# Patient Record
Sex: Female | Born: 1985 | Race: White | Hispanic: No | Marital: Single | Smoking: Never smoker
Health system: Southern US, Community
[De-identification: ages and names within clinical notes are randomized; demographics above are authoritative.]

## PROBLEM LIST (undated history)

## (undated) DIAGNOSIS — E559 Vitamin D deficiency, unspecified: Secondary | ICD-10-CM

## (undated) DIAGNOSIS — S73191A Other sprain of right hip, initial encounter: Secondary | ICD-10-CM

## (undated) DIAGNOSIS — M25851 Other specified joint disorders, right hip: Secondary | ICD-10-CM

## (undated) DIAGNOSIS — I341 Nonrheumatic mitral (valve) prolapse: Secondary | ICD-10-CM

## (undated) DIAGNOSIS — E119 Type 2 diabetes mellitus without complications: Secondary | ICD-10-CM

## (undated) HISTORY — DX: Type 2 diabetes mellitus without complications: E11.9

## (undated) HISTORY — DX: Nonrheumatic mitral (valve) prolapse: I34.1

## (undated) HISTORY — DX: Other sprain of right hip, initial encounter: S73.191A

## (undated) HISTORY — DX: Other specified joint disorders, right hip: M25.851

---
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
## 2007-09-09 HISTORY — PX: TONSILLECTOMY: SUR1361

## 2020-05-08 HISTORY — PX: WISDOM TOOTH EXTRACTION: SHX21

## 2020-07-12 HISTORY — PX: HIP SURGERY: SHX245

## 2020-10-25 ENCOUNTER — Encounter: Payer: Self-pay | Admitting: Family Medicine

## 2020-10-25 ENCOUNTER — Other Ambulatory Visit: Payer: Self-pay

## 2020-10-25 ENCOUNTER — Ambulatory Visit
Admission: RE | Admit: 2020-10-25 | Discharge: 2020-10-25 | Disposition: A | Discharge status: Home / Self Care | Payer: BC Managed Care – PPO | Source: Ambulatory Visit | Attending: Family Medicine | Admitting: Family Medicine

## 2020-10-25 ENCOUNTER — Ambulatory Visit (INDEPENDENT_AMBULATORY_CARE_PROVIDER_SITE_OTHER): Payer: BC Managed Care – PPO | Admitting: Family Medicine

## 2020-10-25 ENCOUNTER — Ambulatory Visit
Admission: RE | Admit: 2020-10-25 | Discharge: 2020-10-25 | Disposition: A | Discharge status: Home / Self Care | Payer: BC Managed Care – PPO | Attending: Family Medicine | Admitting: Family Medicine

## 2020-10-25 VITALS — BP 104/78 | HR 74 | Temp 98.1°F | Ht 65.0 in | Wt 213.0 lb

## 2020-10-25 DIAGNOSIS — S73191A Other sprain of right hip, initial encounter: Secondary | ICD-10-CM | POA: Insufficient documentation

## 2020-10-25 DIAGNOSIS — M25552 Pain in left hip: Secondary | ICD-10-CM | POA: Diagnosis not present

## 2020-10-25 DIAGNOSIS — N912 Amenorrhea, unspecified: Secondary | ICD-10-CM | POA: Diagnosis not present

## 2020-10-25 DIAGNOSIS — M5417 Radiculopathy, lumbosacral region: Secondary | ICD-10-CM

## 2020-10-25 DIAGNOSIS — E119 Type 2 diabetes mellitus without complications: Secondary | ICD-10-CM | POA: Diagnosis not present

## 2020-10-25 DIAGNOSIS — M76891 Other specified enthesopathies of right lower limb, excluding foot: Secondary | ICD-10-CM | POA: Insufficient documentation

## 2020-10-25 DIAGNOSIS — M545 Low back pain, unspecified: Secondary | ICD-10-CM | POA: Diagnosis not present

## 2020-10-25 DIAGNOSIS — S73191S Other sprain of right hip, sequela: Secondary | ICD-10-CM

## 2020-10-25 DIAGNOSIS — M25851 Other specified joint disorders, right hip: Secondary | ICD-10-CM | POA: Insufficient documentation

## 2020-10-25 DIAGNOSIS — M25551 Pain in right hip: Secondary | ICD-10-CM | POA: Insufficient documentation

## 2020-10-25 MED ORDER — DULOXETINE HCL 30 MG PO CPEP: ORAL_CAPSULE | ORAL | 0 refills | Status: DC

## 2020-10-25 MED ORDER — DICLOFENAC SODIUM 50 MG PO TBEC: 50.0000 mg | DELAYED_RELEASE_TABLET | Freq: Two times a day (BID) | ORAL | 0 refills | Status: DC

## 2020-10-25 NOTE — Patient Instructions (Signed)
-   Obtain x-rays - Start diclofenac every a.m. and every p.m. with food - Start duloxetine nightly, increase to 2 capsules after 2 weeks - Referral coordinator will contact you for follow-up with physical therapy, endocrinology, gynecology - Can contact the number below for expedited scheduling - Return for follow-up in 4 weeks, contact us for any questions between now and then  [facility]:  [place]:  [phone]

## 2020-10-25 NOTE — Assessment & Plan Note (Signed)
See additional assessment(s) for plan details. 

## 2020-10-25 NOTE — Assessment & Plan Note (Signed)
Patient with roughly 3 month history of atraumatic left hip pain, localized to the groin without overt radiation.  This is in the setting of right hip arthroscopy for labral tear surgery 3 months prior while out of state in [place].  She had started physical therapy for her right hip but this was limited due to moved to [facility] and stated concerns over physical therapy group.  She does express burning sensation along the left lateral thigh at times to the left foot.  Physical exam findings are positive for focal tenderness at the left groin, equivocal FABER, positive FADIR that recreates pain, negative iliopsoas testing, positive straight leg raise on the left, and tenderness at the left SI joint.  Given her constellation of findings she is most likely experiencing secondary/compensatory pain at the left hip joint and lumbosacral spine where there is concern for left-sided radiculopathy, these are all in the setting of recent right hip surgery.  Have advised further evaluation with dedicated x-rays of the left hip and lumbosacral spine, initiation of duloxetine with titration from 30 mg to 60 mg in 2 weeks time, scheduled diclofenac twice daily, and close follow-up for reevaluation.  We will attempt to obtain outside records from her surgeon for the right hip labrum.

## 2020-10-25 NOTE — Assessment & Plan Note (Signed)
Longstanding amenorrhea in the setting of diabetes mellitus type 2, referral to GYN placed today for further evaluation and management.

## 2020-10-25 NOTE — Progress Notes (Signed)
Primary Care / Sports Medicine Office Visit  Patient Information:  Patient ID: Debbie Travis, female DOB: 1985-10-28 Age: 35 y.o. MRN: [id number]   Debbie Travis is a pleasant 35 y.o. female presenting with the following:  Chief Complaint  Patient presents with   New Patient (Initial Visit)   Establish Care   Hip Pain    Bilateral; history of right hip impingement and surgery; left hip compensating per patient; requesting MRI; 4/10 pain    Review of Systems pertinent details above   Patient Active Problem List   Diagnosis Date Noted   Arthralgia of left hip 10/25/2020   Left lumbosacral radiculopathy 10/25/2020   Diabetes mellitus without complication (HCC) 10/25/2020   Amenorrhea 10/25/2020   Hip impingement syndrome, right 10/25/2020   Labral tear of right hip joint 10/25/2020   Past Medical History:  Diagnosis Date   Diabetes mellitus without complication (HCC)    Hip impingement syndrome, right    Labral tear of right hip joint    Mitral valve prolapse    Outpatient Encounter Medications as of 10/25/2020  Medication Sig   diclofenac (VOLTAREN) 50 MG EC tablet Take 1 tablet (50 mg total) by mouth 2 (two) times daily.   DULoxetine (CYMBALTA) 30 MG capsule Take 1 capsule (30 mg total) by mouth every evening for 14 days, THEN 2 capsules (60 mg total) every evening for 16 days.   gabapentin (NEURONTIN) 300 MG capsule Take 300 mg by mouth at bedtime.   Insulin Regular Human (HUMULIN R U-500 KWIKPEN [place]) Inject 25-50 Units into the skin in the morning, at noon, and at bedtime.   meloxicam (MOBIC) 15 MG tablet Take 15 mg by mouth daily. (Patient not taking: Reported on 10/25/2020)   metoprolol succinate (TOPROL-XL) 50 MG 24 hr tablet Take 50 mg by mouth daily. (Patient not taking: Reported on 10/25/2020)   [DISCONTINUED] insulin aspart (NOVOLOG) 100 UNIT/ML FlexPen insulin aspart (niacinamide)(U-100) 100 unit/mL(3 mL) subcutaneous pen  Inject by subcutaneous  route.   No facility-administered encounter medications on file as of 10/25/2020.   Past Surgical History:  Procedure Laterality Date   CHOLECYSTECTOMY  03/2003   HIP SURGERY Right 07/12/2020   femoral acetabullar impingement   LEG TENDON SURGERY Left 2004   TONSILLECTOMY Bilateral 09/2007   WISDOM TOOTH EXTRACTION Left 05/2020    Vitals:   10/25/20 0836  BP: 104/78  Pulse: 74  Temp: 98.1 F (36.7 C)  SpO2: 98%   Vitals:   10/25/20 0836  Weight: 213 lb (96.6 kg)  Height: 5\' 5"  (1.651 m)   Body mass index is 35.45 kg/m.  No results found.   Independent interpretation of notes and tests performed by another provider:   None  Procedures performed:   None  Pertinent History, Exam, Impression, and Recommendations:   Arthralgia of left hip Patient with roughly 3 month history of atraumatic left hip pain, localized to the groin without overt radiation.  This is in the setting of right hip arthroscopy for labral tear surgery 3 months prior [place]  She had started physical therapy for her right hip but this was limited due to moved to [facility] and stated concerns over physical therapy group.  She does express burning sensation along the left lateral thigh at times to the left foot.  Physical exam findings are positive for focal tenderness at the left groin, equivocal FABER, positive FADIR that recreates pain, negative iliopsoas testing, positive straight  leg raise on the left, and tenderness at the left SI joint.  Given her constellation of findings she is most likely experiencing secondary/compensatory pain at the left hip joint and lumbosacral spine where there is concern for left-sided radiculopathy, these are all in the setting of recent right hip surgery.  Have advised further evaluation with dedicated x-rays of the left hip and lumbosacral spine, initiation of duloxetine with titration from 30 mg to 60 mg in 2 weeks time, scheduled  diclofenac twice daily, and close follow-up for reevaluation.  We will attempt to obtain outside records from her surgeon for the right hip labrum.  Labral tear of right hip joint See additional assessment(s) for plan details.  Hip impingement syndrome, right See additional assessment(s) for plan details.  Left lumbosacral radiculopathy See additional assessment(s) for plan details.  Diabetes mellitus without complication (HCC) Chronic condition for which she has previously been established with endocrinology, has expressed desire for the same.  She is tolerating current insulin, have discussed lifestyle measures and a referral to endocrinology has been placed.  Plan for risk stratification labs at her return for annual physical.  Amenorrhea Longstanding amenorrhea in the setting of diabetes mellitus type 2, referral to GYN placed today for further evaluation and management.   Orders & Medications Meds ordered this encounter  Medications   DULoxetine (CYMBALTA) 30 MG capsule    Sig: Take 1 capsule (30 mg total) by mouth every evening for 14 days, THEN 2 capsules (60 mg total) every evening for 16 days.    Dispense:  46 capsule    Refill:  0   diclofenac (VOLTAREN) 50 MG EC tablet    Sig: Take 1 tablet (50 mg total) by mouth 2 (two) times daily.    Dispense:  60 tablet    Refill:  0   Orders Placed This Encounter  Procedures   DG Lumbar Spine Complete   DG Hip Unilat W OR W/O Pelvis Min 4 Views Left   Ambulatory referral to Endocrinology   Ambulatory referral to Gynecology   Ambulatory referral to Physical Therapy     Return in about 4 weeks (around 11/22/2020).     Jerrol Banana, MD   Primary Care Sports Medicine [facility]

## 2020-10-25 NOTE — Assessment & Plan Note (Addendum)
Chronic condition for which she has previously been established with endocrinology, has expressed desire for the same.  She is tolerating current insulin, have discussed lifestyle measures and a referral to endocrinology has been placed.  Plan for risk stratification labs at her return for annual physical.

## 2020-10-27 ENCOUNTER — Ambulatory Visit: Payer: BC Managed Care – PPO | Attending: Family Medicine | Admitting: Physical Therapy

## 2020-10-27 ENCOUNTER — Other Ambulatory Visit: Payer: Self-pay

## 2020-10-27 DIAGNOSIS — S73191S Other sprain of right hip, sequela: Secondary | ICD-10-CM | POA: Diagnosis not present

## 2020-10-27 DIAGNOSIS — M25551 Pain in right hip: Secondary | ICD-10-CM | POA: Insufficient documentation

## 2020-10-27 DIAGNOSIS — M25851 Other specified joint disorders, right hip: Secondary | ICD-10-CM | POA: Insufficient documentation

## 2020-10-27 DIAGNOSIS — M6281 Muscle weakness (generalized): Secondary | ICD-10-CM | POA: Insufficient documentation

## 2020-10-27 DIAGNOSIS — M5417 Radiculopathy, lumbosacral region: Secondary | ICD-10-CM | POA: Insufficient documentation

## 2020-10-27 DIAGNOSIS — M25552 Pain in left hip: Secondary | ICD-10-CM | POA: Diagnosis not present

## 2020-10-27 NOTE — Therapy (Addendum)
[facility] [address] [place], [place], [address] Phone: [phone]   Fax:  [phone]  Physical Therapy Evaluation  Patient Details  Name: Debbie Travis MRN: [id number] Date of Birth: 1985/12/31 Referring Provider (PT): Jerrol Banana, MD  Encounter Date: 10/27/2020   PT End of Session - 10/28/20 1154     Visit Number 1    Number of Visits 17    Date for PT Re-Evaluation 12/22/20    Authorization Type BCBS, [place] based on [place]    Progress Note Due on Visit 10    PT Start Time 0905    PT Stop Time 0950    PT Time Calculation (min) 45 min    Activity Tolerance Patient tolerated treatment well;No increased pain    Behavior During Therapy WFL for tasks assessed/performed             Past Medical History:  Diagnosis Date   Diabetes mellitus without complication (HCC)    Hip impingement syndrome, right    Labral tear of right hip joint    Mitral valve prolapse     Past Surgical History:  Procedure Laterality Date   CHOLECYSTECTOMY  03/2003   HIP SURGERY Right 07/12/2020   femoral acetabullar impingement   LEG TENDON SURGERY Left 2004   TONSILLECTOMY Bilateral 09/2007   WISDOM TOOTH EXTRACTION Left 05/2020    There were no vitals filed for this visit.    Subjective Assessment - 10/28/20 1156     Subjective Pt is a 35 year old female with L hip pain, R hip s/p labral repair 07/12/20.    Pertinent History Pt is a 35 year old female with complaint of bilateral hip pain. She is s/p R labral repair and resection/acetabuloplasty (pt did not specify acetabulum versus CAM resection, no operative report in chart) for hip impingement (DOS: 07/12/2020). Patient reports L hip feels similar to how R side felt before surgery, but not as much locking and clicking. Pt exhibits "C" sign. Patient reports L hip pain is anterior. Patient does a lot of sitting during her day. Pt reports having pain down to her R knee intermittently.  Patient reports no recent numbness/tingling. (-) imaging on lumbar spine and pelvis/hip. Patient reports that medication "has not worked yet." Patient reports that her surgery was postponed notably due to difficulty with diabetes management. Pt reports waking up due to low blood sugar. Patient reports difficulty getting to sleep due to hip pain, but she does not report disturbed sleep.    Limitations House hold activities;Sitting;Other (comment);Standing   stair negotiation, self-care/tying shoes   Diagnostic tests Radiographs on lumbar spine and pelvis/hips (see below), able to exercise with elliptical    Patient Stated Goals To be able to walk up/down stairs normally    Currently in Pain? Yes    Aggravating Factors  prolonged sitting, bending over, going up steps, difficulty tying shoes due to discomfort with crossing legs, prolonged weightbearing bothers R hip    Pain Relieving Factors on the move/walking               [facility] PT Assessment - 10/27/20 1006       Assessment   Medical Diagnosis Arthralgia of left hip, left lumbosacral radiculopathy, hip impingement syndrome, tear of right acetabular labrum    Referring Provider (PT) Jerrol Banana, MD    Onset Date/Surgical Date 07/12/20    Next MD Visit 11/22/20    Prior Therapy Post-op PT for R hip  Precautions   Precautions None      Restrictions   Weight Bearing Restrictions No      Balance Screen   Has the patient fallen in the past 6 months Yes   1 fall during hypoglyemic episode   How many times? 1    Has the patient had a decrease in activity level because of a fear of falling?  No    Is the patient reluctant to leave their home because of a fear of falling?  No      Prior Function   Level of Independence Independent      Cognition   Overall Cognitive Status Within Functional Limits for tasks assessed              SUBJECTIVE Chief complaint:  Pt is a 35 year old female with L hip pain, R hip s/p labral  repair 07/12/20. History: Pt is a 35 year old female with complaint of bilateral hip pain. She is s/p R labral repair and resection/acetabuloplasty (pt did not specify acetabulum versus CAM resection, no operative report in chart) for hip impingement (DOS: 07/12/2020). Patient reports L hip feels similar to how R side felt before surgery, but not as much locking and clicking. Pt exhibits "C" sign. Patient reports L hip pain is anterior. Patient does a lot of sitting during her day. Pt reports having pain down to her R knee intermittently. Patient reports no recent numbness/tingling. (-) imaging on lumbar spine and pelvis/hip. Patient reports that medication "has not worked yet." Patient reports that her surgery was postponed notably due to difficulty with diabetes management. Pt reports waking up due to low blood sugar. Patient reports difficulty getting to sleep due to hip pain, but she does not report disturbed sleep.   Referring Dx: Arthralgia of left hip, left lumbosacral radiculopathy, hip impingement syndrome, tear of right acetabular labrum Referring Provider: Joseph Berkshire, MD Pain location: L anterior hip, R posterolateral hip  Pain: Worst 6-8/10: ("Sometimes R doesn't hurt at all") Pain quality: sharp, burning, "like it's tearing" Radiating pain: Yes pain into R thigh/knee  Numbness/Tingling: No 24 hour pain behavior: None Aggravating factors: prolonged sitting, bending over, going up steps, difficulty tying shoes due to discomfort with crossing legs, prolonged weightbearing bothers R hip Easing factors: on the move/walking  History of back/hip injury, pain, surgery, or therapy: Yes, R labral repair and debridement 07/12/20 Follow-up appointment with MD: Yes, 11/22/20  Imaging: Yes, radiographs on lumbar spine and Left pelvis/hip. Lumbar spine with no acute abnormalities. Pelvis/hip radiographs with no acute abnormalities.   Falls in the last 6 months: Yes, blood sugar crash about 1 week  ago; difficulty remembering how she fell  Occupational demands: Public librarian, on  computer, work from home Hobbies: cardiovascular exercise with elliptical Goals: Be able to walk up/down stairs normally Red flags (bowel/bladder changes, saddle paresthesia, personal history of cancer, chills/fever, night sweats, unrelenting pain, first onset of insidious LBP <20 y/o) Negative     OBJECTIVE  Mental Status Patient is oriented to person, place and time.  Recent memory is intact.  Remote memory is intact.  Attention span and concentration are intact.  Expressive speech is intact.  Patient's fund of knowledge is within normal limits for educational level.  SENSATION: Grossly intact to light touch bilateral LEs as determined by testing dermatomes L2-S2 Proprioception and hot/cold testing deferred on this date   MUSCULOSKELETAL: Tremor: None Bulk: Normal Tone: Normal No visible step-off along spinal column  Posture Lumbar lordosis: WNL Iliac crest height:  equal bilaterally Mild self-selected kyphotic posture in sitting  Gait Contralateral pelvic drop L>R during stance phase, good weight shift and heel to toe progression   Palpation  Tenderness to palpation along bilateral gluteus medius, R>L vastus lateralis   Strength (out of 5) R/L 4/4- Hip flexion 4+/4 Hip ER 4/4 Hip IR 4-/4- Hip abduction 5/5 Hip adduction [not tested] Hip extension 5/5 Knee extension 4+/4+ Knee flexion *Indicates pain    AROM (degrees) R/L (all movements include overpressure unless otherwise stated) Lumbar forward flexion (65): 75%  Lumbar extension (30): 100% Lumbar lateral flexion (25): R: 100% (pain contralaateral lateral hip) L: 100% (pain contralateral lateral hip) Thoracic and Lumbar rotation (30 degrees):  R: WNL L: WNL Hip IR (0-45): R: 45, L: 45  Hip ER (0-45): R: 45*, L: 45 Hip Flexion (0-125): R WNL, L WNL *Indicates pain   PROM (degrees) Hip IR (0-45): R: 45, L: 45 Hip  ER (0-45): R: 45*, L: 45 Hip Flexion (0-125): R 120, L 120 Hip Abduction (0-40): R: WNL L: WNL Hip extension (0-15): R: not tested L: not tested    Muscle Length Hamstrings: R: lacking 25 degrees L: lacking 20 degrees  [place]: R Normal, L Normal    SPECIAL TESTS Lumbar Radiculopathy and Discogenic: Centralization and Peripheralization (SN 92, -LR 0.12): Not done Slump (SN 83, -LR 0.32): R: Negative L: Negative SLR (SN 92, -LR 0.29): R: Negative L:  Negative Crossed SLR (SP 90): R: Negative L: Negative  Hip: FABER (SN 81): R: Positive L: Negative FADIR (SN 94): R: Negative L: Positive Hip scour (SN 50): R: Not examined L: Not examined  SIJ:  Sacral compression: Negative     Therapeutic Activities Review of home exercises (see Med[employer] Access code) and patient education on current condition, role of PT, prognosis, POC.      ASSESSMENT Clinical Impression: Pt is a pleasant 35 year old female s/p R labral repair (DOS: 07/12/20) with comorbid complaint of L hip pain with positive "C" sign. Clinical presentation consistent with FAI affecting L hip complex with expected R post-operative impairments present at this time. PT examination reveals deficits in difficulty accessing combined FABER position with RLE as needed for crossing legs and performing self-care tasks, decreased hip abduction/flexor/ER/IR and hamstrings strength, mild gait changes, local nociceptive pain in bilateral hips, sensitivity of bilateral gluteus medius and R>L vastus lateralis, and difficulty with prolonged sitting, prolonged standing/walking, bending, and self-care ADLs (those requiring trunk/hip flexion). Pt presents with deficits in strength, mobility, range of motion, and pain. Pt will benefit from skilled PT services to address deficits and return to pain-free function at home and work.     PT Short Term Goals - 10/28/20 1225       PT SHORT TERM GOAL #1   Title Pt will be independent and 100% compliant  with established HEP and activity modification as needed to augment PT intervention and improve mobility/strength to improve level of function    Baseline 10/27/20: Baseline HEP initiated    Time 3    Period Weeks    Status New    Target Date 11/17/20      PT SHORT TERM GOAL #2   Title Patient will have full trunk and hip ROM in all planes without increase in pain as needed for requisite hip mobility to perform self-care ADLs/dressing and bed mobility tasks    Baseline 10/27/20: Pain with bilat thoracolumbar lateral flexion, motion loss with lumbar flexion, pain with R hip FABER position.  Time 4    Period Weeks    Status New    Target Date 11/25/20               PT Long Term Goals - 10/27/20 0959       PT LONG TERM GOAL #1   Title Patient will demonstrate improved function as evidenced by a score of 70 on FOTO measure for full participation in activities at home and in the community.    Baseline 10/27/20: 55    Time 8    Period Weeks    Status New    Target Date 12/22/20      PT LONG TERM GOAL #2   Title Pt will decrease worst pain as reported on NPRS by at least 2 points in order to demonstrate clinically significant reduction in hip pain.    Baseline 10/27/20: pain 6-8/10 at worst    Time 8    Period Weeks    Status New    Target Date 12/22/20      PT LONG TERM GOAL #3   Title Patient will have hip strength to 4+/5 or greater for all motions tested without reproduction of pain indicative of improved capacity for loading paraspinal/pelvic and gluteal mm as needed for capacity to perform prolonged weightbearing activity and return to desired exercises requiring higher-demand muscle performance    Baseline 10/27/20: Hip strength for flexors, L ER, bilat IR, and abductors 4- to 4.    Time 8    Period Weeks    Status New    Target Date 12/22/20      PT LONG TERM GOAL #4   Title Patient will negotiate 4 steps in gym 3 times consecutively with reciprocal pattern and no  UE support on handrail mimicking similar volume of negotiating full flight of stairs with no reproduction of pain and no pelvic drop or other major movement deviation    Baseline 10/27/20: Difficulty with negotiating stairs with her normal pattern/reciprocal stepping.    Time 8    Period Weeks    Status New    Target Date 12/22/20                    Plan - 10/28/20 1220     Clinical Impression Statement Pt is a pleasant 35 year old female s/p R labral repair (DOS: 07/12/20) with comorbid complaint of L hip pain with positive "C" sign. Clinical presentation consistent with FAI affecting L hip complex with expected R post-operative impairments present at this time. PT examination reveals deficits in difficulty accessing combined FABER position with RLE as needed for crossing legs and performing self-care tasks, decreased hip abduction/flexor/ER/IR and hamstrings strength, mild gait changes, local nociceptive pain in bilateral hips, sensitivity of bilateral gluteus medius and R>L vastus lateralis, and difficulty with prolonged sitting, prolonged standing/walking, bending, and self-care ADLs (those requiring trunk/hip flexion). Pt presents with deficits in strength, mobility, range of motion, and pain. Pt will benefit from skilled PT services to address deficits and return to pain-free function at home and work.    Personal Factors and Comorbidities Comorbidity 3+    Comorbidities Type 2 DM, hx of R labral tear and labral repair, mitral valve prolapse    Examination-Activity Limitations Dressing;Sit;Stand;Locomotion Level;Bend;Stairs;Squat    Examination-Participation Restrictions Community Activity;Occupation;Driving    Stability/Clinical Decision Making Evolving/Moderate complexity    Clinical Decision Making Moderate    Rehab Potential Good    PT Frequency 2x / week    PT Duration 8 weeks  PT Treatment/Interventions Electrical Stimulation;Cryotherapy;Moist Heat;Functional mobility  training;Therapeutic activities;Therapeutic exercise;Neuromuscular re-education;Patient/family education;Manual techniques;Dry needling    PT Next Visit Plan Manual therapy for symptom modulation, strategies to improve tolerance of hip ROM, graded hip strengthening with gradual introduction of weightbearing activity/CKC strengthening    PT Home Exercise Plan Access Code [email or web address]    Consulted and Agree with Plan of Care Patient             Patient will benefit from skilled therapeutic intervention in order to improve the following deficits and impairments:  Abnormal gait, Decreased strength, Pain, Difficulty walking  Visit Diagnosis: Pain in left hip - Plan: PT plan of care cert/re-cert  Pain in right hip - Plan: PT plan of care cert/re-cert  Muscle weakness (generalized) - Plan: PT plan of care cert/re-cert     Problem List Patient Active Problem List   Diagnosis Date Noted   Arthralgia of left hip 10/25/2020   Left lumbosacral radiculopathy 10/25/2020   Diabetes mellitus without complication (HCC) 10/25/2020   Amenorrhea 10/25/2020   Hip impingement syndrome, right 10/25/2020   Labral tear of right hip joint 10/25/2020   Consuela Mimes, PT, DPT #[id number]  Gertie Exon, PT 10/28/2020, 12:47 PM  [facility] [address] [place], [place], [address] Phone: [phone]   Fax:  [phone]  Name: Debbie Travis MRN: [id number] Date of Birth: 05-02-1985

## 2020-10-28 ENCOUNTER — Encounter: Payer: Self-pay | Admitting: Physical Therapy

## 2020-10-31 ENCOUNTER — Ambulatory Visit: Payer: BC Managed Care – PPO | Admitting: Physical Therapy

## 2020-11-01 ENCOUNTER — Ambulatory Visit: Payer: BC Managed Care – PPO | Admitting: Physical Therapy

## 2020-11-02 ENCOUNTER — Ambulatory Visit: Payer: BC Managed Care – PPO | Admitting: Physical Therapy

## 2020-11-03 ENCOUNTER — Ambulatory Visit (INDEPENDENT_AMBULATORY_CARE_PROVIDER_SITE_OTHER): Payer: BC Managed Care – PPO | Admitting: Advanced Practice Midwife

## 2020-11-03 ENCOUNTER — Other Ambulatory Visit: Payer: Self-pay

## 2020-11-03 ENCOUNTER — Encounter: Payer: BC Managed Care – PPO | Admitting: Physical Therapy

## 2020-11-03 ENCOUNTER — Encounter: Payer: Self-pay | Admitting: Advanced Practice Midwife

## 2020-11-03 VITALS — BP 110/78 | HR 100 | Ht 65.0 in | Wt 212.0 lb

## 2020-11-03 DIAGNOSIS — N912 Amenorrhea, unspecified: Secondary | ICD-10-CM | POA: Diagnosis not present

## 2020-11-03 DIAGNOSIS — E8881 Metabolic syndrome: Secondary | ICD-10-CM | POA: Diagnosis not present

## 2020-11-03 NOTE — Progress Notes (Signed)
Patient ID: Debbie Travis, female   DOB: 06-Mar-1985, 35 y.o.   MRN: [id number]  Reason for Consult: Gynecologic Exam (C/o no period is 2 yrs or more.)   Referred by Jerrol Banana, MD  Subjective:  HPI:  Debbie Travis is a 35 y.o. female being seen for amenorrhea that is ongoing for the past 2 years. Prior to that she had irregular periods every 30-40 days lasting 5-7 days of moderate flow since the birth of her son 16 years ago. She did try Depo about 6 years ago for about 1 year and prior to that she had limited use of hormonal birth control. She has not had gyn imaging or hormone level testing. She does have history of Type 2 DM on insulin. She denies hirsutism. She is not currently sexually active and does not have concerns for STDs. She does not have a family history of breast or ovarian cancer. She has no desire for conception and she does not care much about having a period unless it is medically necessary. We discussed possible PCOS, hormone testing, gyn imaging. She has a history of cholecystectomy and sometimes has right side abdominal pain especially after eating.  She is a non-smoker, admits generally healthy diet, drinks mostly sweet tea, sleeps about 5-6 hours per night and has limited exercise (mostly due to recent hip surgery).  Past Medical History:  Diagnosis Date   Diabetes mellitus without complication (HCC)    Hip impingement syndrome, right    Labral tear of right hip joint    Mitral valve prolapse    Family History  Problem Relation Age of Onset   Anxiety disorder Mother    Diabetes Mother    Cancer Mother    Appendicitis Mother    Anxiety disorder Brother    Autism Brother    Anxiety disorder Brother    Bipolar disorder Brother    Anxiety disorder Son    Other Son        Hypogammaglobulinemia   Diabetes Maternal Grandmother    Heart attack Maternal Grandmother    Diabetes Maternal Grandfather    Heart attack Maternal Grandfather     Diabetes Paternal Grandmother    Heart attack Paternal Grandmother    Diabetes Paternal Grandfather    Heart attack Paternal Grandfather    Past Surgical History:  Procedure Laterality Date   CHOLECYSTECTOMY  03/2003   HIP SURGERY Right 07/12/2020   femoral acetabullar impingement   LEG TENDON SURGERY Left 2004   TONSILLECTOMY Bilateral 09/2007   WISDOM TOOTH EXTRACTION Left 05/2020    Short Social History:  Social History   Tobacco Use   Smoking status: Never   Smokeless tobacco: Never  Substance Use Topics   Alcohol use: Not Currently    Allergies  Allergen Reactions   Penicillins Anaphylaxis and Rash   Shellfish Allergy Swelling   Citrus Swelling and Rash   Codeine Nausea And Vomiting    Current Outpatient Medications  Medication Sig Dispense Refill   gabapentin (NEURONTIN) 300 MG capsule Take 300 mg by mouth at bedtime.     Insulin Regular Human (HUMULIN R U-500 KWIKPEN [place]) Inject 25-50 Units into the skin in the morning, at noon, and at bedtime. (Patient not taking: Reported on 11/03/2020)     No current facility-administered medications for this visit.   Review of Systems  Constitutional:  Negative for chills and fever.  HENT:  Negative for congestion, ear discharge, ear pain, hearing loss, sinus pain and  sore throat.   Eyes:  Negative for blurred vision and double vision.  Respiratory:  Negative for cough, shortness of breath and wheezing.   Cardiovascular:  Negative for chest pain, palpitations and leg swelling.  Gastrointestinal:  Negative for abdominal pain, blood in stool, constipation, diarrhea, heartburn, melena, nausea and vomiting.  Genitourinary:  Negative for dysuria, flank pain, frequency, hematuria and urgency.  Musculoskeletal:  Negative for back pain, joint pain and myalgias.  Skin:  Negative for itching and rash.  Neurological:  Negative for dizziness, tingling, tremors, sensory change, speech change, focal weakness, seizures, loss of  consciousness, weakness and headaches.  Endo/Heme/Allergies:  Negative for environmental allergies. Does not bruise/bleed easily.       Positive for amenorrhea for 2 years  Psychiatric/Behavioral:  Negative for depression, hallucinations, memory loss, substance abuse and suicidal ideas. The patient is not nervous/anxious and does not have insomnia.        Objective:  Objective   Vitals:   11/03/20 0924  BP: 110/78  Pulse: 100  SpO2: 100%  Weight: 212 lb (96.2 kg)  Height: 5\' 5"  (1.651 m)   Body mass index is 35.28 kg/m. Constitutional: Well nourished, well developed female in no acute distress.  HEENT: normal Skin: Warm and dry.  Cardiovascular: Regular rate and rhythm.   Extremity:  no edema   Respiratory: Clear to auscultation bilateral. Normal respiratory effort Neuro: DTRs 2+, Cranial nerves grossly intact Psych: Alert and Oriented x3. No memory deficits. Normal mood and affect.    Pelvic exam: somewhat limited by body habitus  EGBUS: within normal limits Vagina: within normal limits and with normal mucosa  Cervix: no CMT Uterus: not enlarged, mobile Adnexa: tender on right, no masses palpated   Assessment/Plan:     35 y.o. female with amenorrhea  PCOS labs Estradiol Progesterone Hgb A1C Gyn ultrasound ordered Follow up after imaging with MD   20 CNM [facility] 11/03/2020, 12:09 PM

## 2020-11-03 NOTE — Patient Instructions (Signed)
Diet for Polycystic Ovary Syndrome Polycystic ovary syndrome (PCOS) is a common hormonal disorder that affects a woman's reproductive system. It can cause problems with menstrual periods and make it hard to get and stay pregnant. Changing what you eat can help your hormones reach normal levels, improve your health, and help you better manage PCOS. Following a balanced diet can help you lose weight and improve the way that your body uses the hormone insulin to control blood sugar. This may include: Eating low-fat (lean) proteins, complex carbohydrates, fresh fruits and vegetables, low-fat dairy products, healthy fats, and fiber. Cutting down on calories. Exercising regularly. What are tips for following this plan? Follow a balanced diet for meals and snacks. Eat breakfast, lunch, dinner, and one or two snacks every day. Include protein in each meal and snack. Choose whole grains instead of products that are made with refined flour. Eat a variety of foods. Exercise regularly as told by your health care provider. Aim to do at least 30 minutes of exercise on most days of the week. If you are overweight or obese: Pay attention to how many calories you eat. Cutting down on calories can help you lose weight. Work with your health care provider or a dietitian to figure out how many calories you need each day. What foods should I eat? Fruits Include a variety of colors and types. All fruits are helpful for PCOS. Vegetables Include a variety of colors and types. All vegetables are helpful for PCOS. Grains Whole grains, such as whole wheat. Whole-grain breads, crackers, cereals, and pasta. Unsweetened oatmeal. Bulgur, barley, quinoa, and brown rice. Tortillas made from corn or whole-wheat flour. Meats and other proteins Lean proteins, such as fish, chicken, beans, eggs, and tofu. Dairy Low-fat dairy products, such as skim milk, cheese sticks, and yogurt. Beverages Low-fat or fat-free drinks, such as  water, low-fat milk, sugar-free drinks, and small amounts of 100% fruit juice. Seasonings and condiments Ketchup. Mustard. Barbecue sauce. Relish. Low-fat or fat-free mayonnaise. Fats and oils Olive oil or canola oil. Walnuts and almonds. The items listed above may not be a complete list of recommended foods and beverages. Contact a dietitian for more options. What foods should I avoid? Foods that are high in calories or fat, especially saturated or trans fats. Fried foods. Sweets. Products that are made from refined white flour, including white bread, pastries, white rice, and pasta. The items listed above may not be a complete list of foods and beverages to avoid. Contact a dietitian for more information. Summary PCOS is a hormonal imbalance that affects a woman's reproductive system. It can cause problems with menstrual periods and make it hard to get and stay pregnant. You can help to manage your PCOS by exercising regularly and eating a healthy, varied diet of vegetables, fruit, whole grains, lean protein, and low-fat dairy products. Changing what you eat can improve the way that your body uses insulin, help your hormones reach normal levels, and help you lose weight. This information is not intended to replace advice given to you by your health care provider. Make sure you discuss any questions you have with your health care provider. Document Revised: 06/04/2019 Document Reviewed: 06/04/2019 Elsevier Patient Education  2022 Elsevier Inc. Polycystic Ovary Syndrome Polycystic ovarian syndrome (PCOS) is a common hormonal disorder among women of reproductive age. In most women with PCOS, small fluid-filled sacs (cysts) grow on the ovaries. PCOS can cause problems with menstrual periods and make it hard to get and stay pregnant. If  this condition is not treated, it can lead to serious health problems, such as diabetes and heart disease. What are the causes? The cause of this condition is not  known. It may be due to certain factors, such as: Irregular menstrual cycle. High levels of certain hormones. Problems with the hormone that helps to control blood sugar (insulin). Certain genes. What increases the risk? You are more likely to develop this condition if you: Have a family history of PCOS or type 2 diabetes. Are overweight, eat unhealthy foods, and are not active. These factors may cause problems with blood sugar control, which can contribute to PCOS or PCOS symptoms. What are the signs or symptoms? Symptoms of this condition include: Ovarian cysts and sometimes pelvic pain. Menstrual periods that are not regular or are too heavy. Inability to get or stay pregnant. Increased growth of hair on the face, chest, stomach, back, thumbs, thighs, or toes. Acne or oily skin. Acne may develop during adulthood, and it may not get better with treatment. Weight gain or obesity. Patches of thickened and dark brown or black skin on the neck, arms, breasts, or thighs. How is this diagnosed? This condition is diagnosed based on: Your medical history. A physical exam that includes a pelvic exam. Your health care provider may look for areas of increased hair growth on your skin. Tests, such as: An ultrasound to check the ovaries for cysts and to view the lining of the uterus. Blood tests to check levels of sugar (glucose), female hormone (testosterone), and female hormones (estrogen and progesterone). How is this treated? There is no cure for this condition, but treatment can help to manage symptoms and prevent more health problems from developing. Treatment varies depending on your symptoms and if you want to have a baby or if you need birth control. Treatment may include: Making nutrition and lifestyle changes. Taking the progesterone hormone to start a menstrual period. Taking birth control pills to help you have regular menstrual periods. Taking medicines such as: Medicines to make you  ovulate, if you want to get pregnant. Medicine to reduce extra hair growth. Having surgery in severe cases. This may involve making small holes in one or both of your ovaries. This decreases the amount of testosterone that your body makes. Follow these instructions at home: Take over-the-counter and prescription medicines only as told by your health care provider. Follow a healthy meal plan that includes lean proteins, complex carbohydrates, fresh fruits and vegetables, low-fat dairy products, healthy fats, and fiber. If you are overweight, lose weight as told by your health care provider. Your health care provider can determine how much weight loss is best for you and can help you lose weight safely. Keep all follow-up visits. This is important. Contact a health care provider if: Your symptoms do not get better with medicine. Your symptoms get worse or you develop new symptoms. Summary Polycystic ovarian syndrome (PCOS) is a common hormonal disorder among women of reproductive age. PCOS can cause problems with menstrual periods and make it hard to get and stay pregnant. If this condition is not treated, it can lead to serious health problems, such as diabetes and heart disease. There is no cure for this condition, but treatment can help to manage symptoms and prevent more health problems from developing. This information is not intended to replace advice given to you by your health care provider. Make sure you discuss any questions you have with your health care provider. Document Revised: 06/04/2019 Document Reviewed: 06/04/2019  Elsevier Patient Education  2022 Elsevier Inc.  

## 2020-11-07 DIAGNOSIS — M25551 Pain in right hip: Secondary | ICD-10-CM | POA: Diagnosis not present

## 2020-11-08 ENCOUNTER — Encounter: Payer: BC Managed Care – PPO | Admitting: Physical Therapy

## 2020-11-08 ENCOUNTER — Ambulatory Visit: Payer: BC Managed Care – PPO | Attending: Family Medicine | Admitting: Physical Therapy

## 2020-11-08 NOTE — Patient Instructions (Incomplete)
°  TREATMENT   Manual Therapy - for symptom modulation, soft tissue sensitivity and mobility, joint mobility, ROM   STM/DTM bilateral vatus lateralis, bilateral gluteus medius in prone R hip PROM with gentle stretching for hip ER L hip MWM?   Neuromuscular Re-education - for nervous system downregulation, gluteal musculature activation and exercises to promote LE kinetic chain stability    Therapeutic Exercise - for improved soft tissue flexibility and extensibility as needed for ROM, improved strength as needed to improve performance of CKC activities/functional movements  Supine active hamstrings stretch; 20x, 1 sec; bilaterally Sidelying hip abduction; 2x10, bilaterally Glute bridge; 2x10    ASSESSMENT Patient arrives with excellent motivation to participate in physical therapy. . Patient has remaining deficits in ROM, mobility, and pain. Patient will benefit from continued skilled therapeutic intervention to address the above deficits as needed for improved function and QoL.

## 2020-11-09 DIAGNOSIS — K2970 Gastritis, unspecified, without bleeding: Secondary | ICD-10-CM | POA: Diagnosis not present

## 2020-11-09 DIAGNOSIS — Z20822 Contact with and (suspected) exposure to covid-19: Secondary | ICD-10-CM | POA: Diagnosis not present

## 2020-11-09 DIAGNOSIS — R1031 Right lower quadrant pain: Secondary | ICD-10-CM | POA: Diagnosis not present

## 2020-11-09 DIAGNOSIS — R1011 Right upper quadrant pain: Secondary | ICD-10-CM | POA: Diagnosis not present

## 2020-11-09 DIAGNOSIS — R1013 Epigastric pain: Secondary | ICD-10-CM | POA: Diagnosis not present

## 2020-11-09 DIAGNOSIS — E1369 Other specified diabetes mellitus with other specified complication: Secondary | ICD-10-CM | POA: Diagnosis not present

## 2020-11-09 DIAGNOSIS — R112 Nausea with vomiting, unspecified: Secondary | ICD-10-CM | POA: Diagnosis not present

## 2020-11-09 DIAGNOSIS — K449 Diaphragmatic hernia without obstruction or gangrene: Secondary | ICD-10-CM | POA: Diagnosis not present

## 2020-11-10 ENCOUNTER — Encounter: Payer: BC Managed Care – PPO | Admitting: Physical Therapy

## 2020-11-10 DIAGNOSIS — R1011 Right upper quadrant pain: Secondary | ICD-10-CM | POA: Diagnosis not present

## 2020-11-10 DIAGNOSIS — K449 Diaphragmatic hernia without obstruction or gangrene: Secondary | ICD-10-CM | POA: Diagnosis not present

## 2020-11-10 DIAGNOSIS — R1031 Right lower quadrant pain: Secondary | ICD-10-CM | POA: Diagnosis not present

## 2020-11-11 NOTE — Addendum Note (Signed)
Addended by: Tresea Mall on: 11/11/2020 04:29 PM   Modules accepted: Orders

## 2020-11-13 LAB — TSH+PRL+FSH+TESTT+LH+DHEA S...
17-Hydroxyprogesterone: <10 ng/dL
Androstenedione: 31 ng/dL — ABNORMAL LOW (ref 41–262)
DHEA-SO4: 222 ug/dL (ref 84.8–378.0)
FSH: 43.7 m[IU]/mL
LH: 26.6 m[IU]/mL
Prolactin: 11.1 ng/mL (ref 4.8–23.3)
TSH: 2.63 u[IU]/mL (ref 0.450–4.500)
Testosterone, Free: 2.3 pg/mL (ref 0.0–4.2)
Testosterone: 25 ng/dL (ref 8–60)

## 2020-11-13 LAB — PROGESTERONE: Progesterone: 0.1 ng/mL

## 2020-11-13 LAB — HGB A1C W/O EAG: Hgb A1c MFr Bld: 8.6 % — ABNORMAL HIGH (ref 4.8–5.6)

## 2020-11-13 LAB — ESTRADIOL: Estradiol: 7.1 pg/mL

## 2020-11-14 ENCOUNTER — Other Ambulatory Visit: Payer: Self-pay

## 2020-11-14 ENCOUNTER — Encounter: Payer: Self-pay | Admitting: Family Medicine

## 2020-11-14 ENCOUNTER — Ambulatory Visit (INDEPENDENT_AMBULATORY_CARE_PROVIDER_SITE_OTHER): Payer: BC Managed Care – PPO | Admitting: Family Medicine

## 2020-11-14 VITALS — BP 112/82 | HR 75 | Temp 98.1°F | Ht 65.0 in | Wt 204.0 lb

## 2020-11-14 DIAGNOSIS — K2900 Acute gastritis without bleeding: Secondary | ICD-10-CM | POA: Insufficient documentation

## 2020-11-14 DIAGNOSIS — M25552 Pain in left hip: Secondary | ICD-10-CM | POA: Diagnosis not present

## 2020-11-14 DIAGNOSIS — M5417 Radiculopathy, lumbosacral region: Secondary | ICD-10-CM

## 2020-11-14 MED ORDER — PANTOPRAZOLE SODIUM 40 MG PO TBEC: 40.0000 mg | DELAYED_RELEASE_TABLET | Freq: Every day | ORAL | 0 refills | Status: DC

## 2020-11-14 MED ORDER — PROMETHAZINE HCL 25 MG PO TABS: 25.0000 mg | ORAL_TABLET | Freq: Four times a day (QID) | ORAL | 0 refills | Status: DC | PRN

## 2020-11-14 NOTE — Patient Instructions (Addendum)
-   Take pantoprazole daily on empty stomach until seeing GI - Can use promethazine (Phenergan) as-needed for nausea - Stick to bland GI diet (see information) - Referral coordinator will contact to follow-up with GI - Touch base with GYN for follow-up - Reach out to [place] if you find an endocrinologist, we can coordinate referral - Return in 2 months - Contact for questions

## 2020-11-14 NOTE — Assessment & Plan Note (Signed)
Ongoing symptomatology, patient states this is secondary to current GI symptoms.  We did review the x-rays of her lumbar spine and left hip, she cannot tolerate physical therapy at this time due to GI symptoms.  We will hold from pharmacotherapy such as NSAIDs given GI concern, prednisone given diabetes mellitus, and opioid analgesics and concern for decreased GI motility.  Patient is understanding and amenable to holding from further treatment until GI issue resolved.  We will schedule a follow-up in 2 months for reevaluation.

## 2020-11-14 NOTE — Assessment & Plan Note (Signed)
See additional assessment(s) for plan details. 

## 2020-11-14 NOTE — Progress Notes (Signed)
(    Primary Care / Sports Medicine Office Visit  Patient Information:  Patient ID: Debbie Travis, female DOB: 02-28-1985 Age: 35 y.o. MRN: [id number]   Debbie Travis is a pleasant 36 y.o. female presenting with the following:  Chief Complaint  Patient presents with   Follow-up    ER 11/09/20    Review of Systems pertinent details above   Patient Active Problem List   Diagnosis Date Noted   Acute gastritis 11/14/2020   Arthralgia of left hip 11/14/2020   Pain of right hip joint 10/25/2020   Left lumbosacral radiculopathy 10/25/2020   Diabetes mellitus without complication (HCC) 10/25/2020   Amenorrhea 10/25/2020   Hip impingement syndrome, right 10/25/2020   Labral tear of right hip joint 10/25/2020   Past Medical History:  Diagnosis Date   Diabetes mellitus without complication (HCC)    Hip impingement syndrome, right    Labral tear of right hip joint    Mitral valve prolapse    Outpatient Encounter Medications as of 11/14/2020  Medication Sig   gabapentin (NEURONTIN) 300 MG capsule Take 300 mg by mouth at bedtime.   Insulin Regular Human (HUMULIN R U-500 KWIKPEN [place]) Inject 25-50 Units into the skin in the morning, at noon, and at bedtime.   pantoprazole (PROTONIX) 40 MG tablet Take 1 tablet (40 mg total) by mouth daily.   promethazine (PHENERGAN) 25 MG tablet Take 1 tablet (25 mg total) by mouth every 6 (six) hours as needed for nausea or vomiting.   [DISCONTINUED] famotidine (PEPCID) 40 MG tablet Take 20 mg by mouth 2 (two) times daily.   [DISCONTINUED] omeprazole (PRILOSEC) 20 MG capsule Take 20 mg by mouth daily.   No facility-administered encounter medications on file as of 11/14/2020.   Past Surgical History:  Procedure Laterality Date   CHOLECYSTECTOMY  03/2003   HIP SURGERY Right 07/12/2020   femoral acetabullar impingement   LEG TENDON SURGERY Left 2004   TONSILLECTOMY Bilateral 09/2007   WISDOM TOOTH EXTRACTION Left 05/2020    Vitals:    11/14/20 1447  BP: 112/82  Pulse: 75  Temp: 98.1 F (36.7 C)  SpO2: 97%   Vitals:   11/14/20 1447  Weight: 204 lb (92.5 kg)  Height: 5\' 5"  (1.651 m)   Body mass index is 33.95 kg/m.  DG Lumbar Spine Complete  Result Date: 10/25/2020 CLINICAL DATA:  Sacroiliac joint pain on the left for 2 months with L5-S1 radiculopathy EXAM: LUMBAR SPINE - COMPLETE 4+ VIEW COMPARISON:  None. FINDINGS: Five lumbar type vertebral bodies are well visualized. No pars defects are seen. No anterolisthesis is noted. Vertebral body height is well maintained. No significant disc pathology is noted. Incidental note is made of nonobstructing left renal stones. Mild retained fecal material is seen. IMPRESSION: No acute bony abnormality noted. Nonobstructing left renal stones measuring up to 3 mm. Electronically Signed   By: 10/27/2020 M.D.   On: 10/25/2020 23:56   DG Hip Unilat W OR W/O Pelvis Min 4 Views Left  Result Date: 10/25/2020 CLINICAL DATA:  Left hip pain for 2 months, no known injury, initial encounter EXAM: DG HIP (WITH OR WITHOUT PELVIS) 3V LEFT COMPARISON:  None. FINDINGS: Pelvic ring is intact. No acute fracture or dislocation is noted. No soft tissue abnormality is seen. IMPRESSION: No acute abnormality noted. Electronically Signed   By: 10/27/2020 M.D.   On: 10/25/2020 23:57     Independent interpretation of notes and tests performed by another provider:  None  Procedures performed:   None  Pertinent History, Exam, Impression, and Recommendations:   Acute gastritis Acute issue in the setting of insulin-dependent diabetes mellitus necessitating ER visit on 11/09/2020.  CT scan obtained at that time was significant for multiple omental calcifications, possible spilled gallstones in the setting of prior cholecystectomy, hiatal hernia.  Diagnosis of gastritis placed, she is still experiencing severe colicky abdominal pain, intermittent nausea and emesis.  She is having daily bowel movements  without sensation of completion.  Of note, recent lumbar spine x-ray did reveal a sending and transverse colon stool burden.  Concern for concomitant diabetic gastroparesis element.  Physical exam reveals obese, soft abdomen that is tender at the epigastrium, right upper and right lower quadrants, remainder benign, no hepatosplenomegaly, hypoactive bowel sounds, no rebound or guarding.  I have advised urgent referral to gastroenterology, into interim I have advised a bland GI diet, scheduled pantoprazole, as needed Phenergan, and consideration of bowel regimen.  A referral was placed, we will follow peripherally on this issue.  Left lumbosacral radiculopathy Ongoing symptomatology, patient states this is secondary to current GI symptoms.  We did review the x-rays of her lumbar spine and left hip, she cannot tolerate physical therapy at this time due to GI symptoms.  We will hold from pharmacotherapy such as NSAIDs given GI concern, prednisone given diabetes mellitus, and opioid analgesics and concern for decreased GI motility.  Patient is understanding and amenable to holding from further treatment until GI issue resolved.  We will schedule a follow-up in 2 months for reevaluation.  Arthralgia of left hip See additional assessment(s) for plan details.   Outside information including CT scan, ER visit, GYN visit, and Ortho visits reviewed.  Orders & Medications Meds ordered this encounter  Medications   pantoprazole (PROTONIX) 40 MG tablet    Sig: Take 1 tablet (40 mg total) by mouth daily.    Dispense:  30 tablet    Refill:  0   promethazine (PHENERGAN) 25 MG tablet    Sig: Take 1 tablet (25 mg total) by mouth every 6 (six) hours as needed for nausea or vomiting.    Dispense:  30 tablet    Refill:  0   Orders Placed This Encounter  Procedures   Ambulatory referral to Gastroenterology     Return in about 2 months (around 01/14/2021).     Jerrol Banana, MD   Primary Care Sports  Medicine [facility]

## 2020-11-14 NOTE — Assessment & Plan Note (Signed)
Acute issue in the setting of insulin-dependent diabetes mellitus necessitating ER visit on 11/09/2020.  CT scan obtained at that time was significant for multiple omental calcifications, possible spilled gallstones in the setting of prior cholecystectomy, hiatal hernia.  Diagnosis of gastritis placed, she is still experiencing severe colicky abdominal pain, intermittent nausea and emesis.  She is having daily bowel movements without sensation of completion.  Of note, recent lumbar spine x-ray did reveal a sending and transverse colon stool burden.  Concern for concomitant diabetic gastroparesis element.  Physical exam reveals obese, soft abdomen that is tender at the epigastrium, right upper and right lower quadrants, remainder benign, no hepatosplenomegaly, hypoactive bowel sounds, no rebound or guarding.  I have advised urgent referral to gastroenterology, into interim I have advised a bland GI diet, scheduled pantoprazole, as needed Phenergan, and consideration of bowel regimen.  A referral was placed, we will follow peripherally on this issue.

## 2020-11-15 ENCOUNTER — Encounter: Payer: BC Managed Care – PPO | Admitting: Physical Therapy

## 2020-11-15 ENCOUNTER — Ambulatory Visit (INDEPENDENT_AMBULATORY_CARE_PROVIDER_SITE_OTHER): Payer: BC Managed Care – PPO | Admitting: Gastroenterology

## 2020-11-15 VITALS — BP 105/71 | HR 81 | Temp 98.3°F | Ht 65.0 in | Wt 205.0 lb

## 2020-11-15 DIAGNOSIS — R1084 Generalized abdominal pain: Secondary | ICD-10-CM | POA: Diagnosis not present

## 2020-11-15 DIAGNOSIS — M25551 Pain in right hip: Secondary | ICD-10-CM | POA: Diagnosis not present

## 2020-11-15 DIAGNOSIS — R112 Nausea with vomiting, unspecified: Secondary | ICD-10-CM

## 2020-11-15 NOTE — Progress Notes (Signed)
Debbie Mood MD, MRCP(U.K) [address]  [address]  [place], [place] [address]  Main: [phone]  Fax: [phone]   Gastroenterology Consultation  Referring Provider:     Jerrol Banana, MD Primary Care Physician:  Debbie Banana, MD Primary Gastroenterologist:  Dr. Wyline Travis  Reason for Consultation:    Abdominal pain        HPI:   Debbie Travis is a 35 y.o. y/o female referred for consultation & management  by Dr. Ashley Travis, Debbie Bob, MD.    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  She is a type I diabetic and has been on insulin.  She states that for the past week if not longer she has had chronic nausea usually worse after dinnertime.  She does throw up but has never been attention to the contents of her vomitus.  Denies any exposure to marijuana.  Denies any NSAID use.  Complains of a burning sensation in her stomach.  Her blood sugars have been ranging from 180-210.  She states that her HbA1c previously was 14.  She states that her insulin is managed by her primary care doctor.  On Phenergan.  She does have daily bowel movements.  Past Medical History:  Diagnosis Date   Diabetes mellitus without complication (HCC)    Hip impingement syndrome, right    Labral tear of right hip joint    Mitral valve prolapse     Past Surgical History:  Procedure Laterality Date   CHOLECYSTECTOMY  03/2003   HIP SURGERY Right 07/12/2020   femoral acetabullar impingement   LEG TENDON SURGERY Left 2004   TONSILLECTOMY Bilateral 09/2007   WISDOM TOOTH EXTRACTION Left 05/2020    Prior to Admission medications   Medication Sig Start Date End Date Taking? Authorizing Provider  gabapentin  (NEURONTIN) 300 MG capsule Take 300 mg by mouth at bedtime.    [provider]  Insulin Regular Human (HUMULIN R U-500 KWIKPEN [place]) Inject 25-50 Units into the skin in the morning, at noon, and at bedtime.    [provider]  pantoprazole (PROTONIX) 40 MG tablet Take 1 tablet (40 mg total) by mouth daily. 11/14/20   Debbie Banana, MD  promethazine (PHENERGAN) 25 MG tablet Take 1 tablet (25 mg total) by mouth every 6 (six) hours as needed for nausea or vomiting. 11/14/20   Debbie Banana, MD    Family History  Problem Relation Age of Onset   Anxiety disorder Mother    Diabetes Mother    Cancer Mother    Appendicitis Mother    Anxiety disorder Brother    Autism Brother    Anxiety disorder Brother    Bipolar disorder Brother    Anxiety disorder Son    Other Son        Hypogammaglobulinemia   Diabetes Maternal Grandmother    Heart attack Maternal Grandmother    Diabetes Maternal Grandfather    Heart attack Maternal Grandfather    Diabetes Paternal Grandmother    Heart attack Paternal Grandmother    Diabetes Paternal Grandfather    Heart attack Paternal Grandfather      Social History   Tobacco Use   Smoking status: Never   Smokeless tobacco: Never  Vaping Use  Vaping Use: Never used  Substance Use Topics   Alcohol use: Not Currently   Drug use: Never    Allergies as of 11/15/2020 - Review Complete 11/14/2020  Allergen Reaction Noted   Citrus Swelling and Rash 10/25/2020   Penicillins Anaphylaxis and Rash 10/25/2020   Shellfish allergy Swelling 10/25/2020   Codeine Nausea And Vomiting 10/25/2020    Review of Systems:    All systems reviewed and negative except where noted in HPI.   Physical Exam:  LMP  (LMP Unknown)  No LMP recorded (lmp unknown). (Menstrual status: Irregular Periods). Psych:  Alert and cooperative. Normal Travis and affect. General:   Alert,  Well-developed, well-nourished, pleasant and cooperative in NAD Head:  Normocephalic  and atraumatic. Eyes:  Sclera clear, no icterus.   Conjunctiva pink. Abdomen:  Normal bowel sounds.  No bruits.  Soft, non-tender and non-distended without masses, hepatosplenomegaly or hernias noted.  No guarding or rebound tenderness.   . Neurologic:  Alert and oriented x3;  grossly normal neurologically. Psych:  Alert and cooperative. Normal Travis and affect.  Imaging Studies: DG Lumbar Spine Complete  Result Date: 10/25/2020 CLINICAL DATA:  Sacroiliac joint pain on the left for 2 months with L5-S1 radiculopathy EXAM: LUMBAR SPINE - COMPLETE 4+ VIEW COMPARISON:  None. FINDINGS: Five lumbar type vertebral bodies are well visualized. No pars defects are seen. No anterolisthesis is noted. Vertebral body height is well maintained. No significant disc pathology is noted. Incidental note is made of nonobstructing left renal stones. Mild retained fecal material is seen. IMPRESSION: No acute bony abnormality noted. Nonobstructing left renal stones measuring up to 3 mm. Electronically Signed   By: Alcide Clever M.D.   On: 10/25/2020 23:56   DG Hip Unilat W OR W/O Pelvis Min 4 Views Left  Result Date: 10/25/2020 CLINICAL DATA:  Left hip pain for 2 months, no known injury, initial encounter EXAM: DG HIP (WITH OR WITHOUT PELVIS) 3V LEFT COMPARISON:  None. FINDINGS: Pelvic ring is intact. No acute fracture or dislocation is noted. No soft tissue abnormality is seen. IMPRESSION: No acute abnormality noted. Electronically Signed   By: Alcide Clever M.D.   On: 10/25/2020 23:57    Assessment and Plan:   Debbie Travis is a 35 y.o. y/o female has been referred for abdominal pain, nausea which is very highly suspicious of diabetic gastroparesis.  Plan 1.  H. pylori breath test 2.  EGD to rule out gastric outlet obstruction. 3.  Discussed in depth about diabetes and its relation to gastroparesis and the importance to keep blood sugars as close as possible to less than 150 mg/dL.  Explained that definitely  blood sugars go over 150 mg/dL it impairs the emptying of the stomach.  Explained to consume a diet low in fat and fiber with small meals being more often rather than large meals at 1 time.  Patient information provided.  If no better at next visit can consider Reglan when symptoms are significantly used on a short-term basis.   I have discussed alternative options, risks & benefits,  which include, but are not limited to, bleeding, infection, perforation,respiratory complication & drug reaction.  The patient agrees with this plan & written consent will be obtained.     Follow up in 6 weeks   Dr Debbie Mood MD,MRCP(U.K)

## 2020-11-17 ENCOUNTER — Encounter: Payer: BC Managed Care – PPO | Admitting: Physical Therapy

## 2020-11-17 LAB — H. PYLORI BREATH TEST: H pylori Breath Test: NEGATIVE

## 2020-11-18 ENCOUNTER — Other Ambulatory Visit: Payer: Self-pay

## 2020-11-18 MED ORDER — OMEPRAZOLE 40 MG PO CPDR: 40.0000 mg | DELAYED_RELEASE_CAPSULE | Freq: Two times a day (BID) | ORAL | 0 refills | Status: DC

## 2020-11-21 ENCOUNTER — Encounter: Payer: BC Managed Care – PPO | Admitting: Physical Therapy

## 2020-11-22 ENCOUNTER — Telehealth: Payer: Self-pay

## 2020-11-22 ENCOUNTER — Encounter: Payer: BC Managed Care – PPO | Admitting: Physical Therapy

## 2020-11-22 ENCOUNTER — Other Ambulatory Visit: Payer: Self-pay | Admitting: Family Medicine

## 2020-11-22 ENCOUNTER — Ambulatory Visit: Payer: BC Managed Care – PPO | Admitting: Family Medicine

## 2020-11-22 DIAGNOSIS — M5417 Radiculopathy, lumbosacral region: Secondary | ICD-10-CM

## 2020-11-22 DIAGNOSIS — R112 Nausea with vomiting, unspecified: Secondary | ICD-10-CM

## 2020-11-22 DIAGNOSIS — M25552 Pain in left hip: Secondary | ICD-10-CM

## 2020-11-22 MED ORDER — METOCLOPRAMIDE HCL 5 MG/5ML PO SOLN: 5.0000 mg | Freq: Three times a day (TID) | ORAL | 0 refills | Status: DC

## 2020-11-22 NOTE — Telephone Encounter (Signed)
Refill if appropriate.  Please advise. Recent gastritis.

## 2020-11-22 NOTE — Telephone Encounter (Signed)
Requested medications are due for refill today NO  Requested medications are on the active medication list NO  Last refill 10/25/20  Last visit 10/25/20  Future visit scheduled NO  Notes to clinic pt was to return in one month, pt canceled appt this week, no lab work on record, question if med completed course, please assess. Requested Prescriptions  Pending Prescriptions Disp Refills   diclofenac (VOLTAREN) 50 MG EC tablet [Pharmacy Med Name: DICLOFENAC SOD EC 50 MG TAB] 60 tablet 0    Sig: TAKE 1 TABLET BY MOUTH TWICE A DAY     Analgesics:  NSAIDS Failed - 11/22/2020  1:28 AM      Failed - Cr in normal range and within 360 days    No results found for: CREATININE, LABCREAU, LABCREA, POCCRE        Failed - HGB in normal range and within 360 days    No results found for: HGB, HGBKUC, HGBPOCKUC, HGBOTHER, TOTHGB, HGBPLASMA        Passed - Patient is not pregnant      Passed - Valid encounter within last 12 months    Recent Outpatient Visits           1 week ago Acute gastritis, presence of bleeding unspecified, unspecified gastritis type   [facility] Jerrol Banana, MD   4 weeks ago Arthralgia of left hip   [facility] Jerrol Banana, MD       Future Appointments             In 1 month [name], MD [facility]

## 2020-11-22 NOTE — Addendum Note (Signed)
Addended by: Adela Ports on: 11/22/2020 04:48 PM   Modules accepted: Orders

## 2020-11-22 NOTE — Telephone Encounter (Signed)
If her GI is okay with NSAIDs I can refill, we will need approval from them first.

## 2020-11-22 NOTE — Telephone Encounter (Signed)
Called patient to let her know what Dr. Allegra Lai recommended and at the end the patient agreed. Patient had no further questions.

## 2020-11-22 NOTE — Telephone Encounter (Signed)
Patient was changed to a different date for her colonoscopy with Dr. Tobi Bastos since she was scheduled this week to have it done. However, patient stated that she wanted to know what she could do since she continues to have the abdominal pain, nausea and vomiting. Patient stated that she had tried Dr. Johnney Killian recommendations but it had not helped her at all. Patient also stated that her sugar levels have never been under 120 and that she had mentioned it to all of her doctors and that nobody listens to her. Can you please advise on what the patient is able to do. Thank you.

## 2020-11-22 NOTE — Telephone Encounter (Signed)
Go ahead and schedule gastric emptying study Have her try Reglan 5 mg before each meal and at bedtime, just give her for 14 days only to see if it helps  RV

## 2020-11-22 NOTE — Telephone Encounter (Signed)
Okay to refill? 

## 2020-11-23 ENCOUNTER — Ambulatory Visit
Admission: RE | Admit: 2020-11-23 | Discharge: 2020-11-23 | Disposition: A | Discharge status: Home / Self Care | Payer: BC Managed Care – PPO | Source: Ambulatory Visit | Attending: Advanced Practice Midwife | Admitting: Advanced Practice Midwife

## 2020-11-23 ENCOUNTER — Encounter: Payer: Self-pay | Admitting: Family Medicine

## 2020-11-23 DIAGNOSIS — E8881 Metabolic syndrome: Secondary | ICD-10-CM | POA: Insufficient documentation

## 2020-11-23 DIAGNOSIS — N912 Amenorrhea, unspecified: Secondary | ICD-10-CM | POA: Diagnosis not present

## 2020-11-24 ENCOUNTER — Encounter: Payer: BC Managed Care – PPO | Admitting: Physical Therapy

## 2020-11-24 ENCOUNTER — Telehealth: Payer: Self-pay | Admitting: Family Medicine

## 2020-11-24 NOTE — Telephone Encounter (Signed)
For your information  

## 2020-11-24 NOTE — Telephone Encounter (Signed)
Debbie Travis, from [facility], calling stating that they are needing to have additional info in order to have this pt seen at their office. She states that they are needing to have contact information and most recent labs. Please advise.      Callback # [phone] or Fax# [phone]

## 2020-11-28 ENCOUNTER — Encounter: Payer: Self-pay | Admitting: Advanced Practice Midwife

## 2020-11-29 ENCOUNTER — Encounter: Payer: BC Managed Care – PPO | Admitting: Physical Therapy

## 2020-12-02 ENCOUNTER — Other Ambulatory Visit: Payer: Self-pay

## 2020-12-02 ENCOUNTER — Encounter
Admission: RE | Admit: 2020-12-02 | Discharge: 2020-12-02 | Disposition: A | Discharge status: Home / Self Care | Payer: BC Managed Care – PPO | Source: Ambulatory Visit | Attending: Gastroenterology | Admitting: Gastroenterology

## 2020-12-02 DIAGNOSIS — R112 Nausea with vomiting, unspecified: Secondary | ICD-10-CM | POA: Diagnosis not present

## 2020-12-02 DIAGNOSIS — R109 Unspecified abdominal pain: Secondary | ICD-10-CM | POA: Diagnosis not present

## 2020-12-02 MED ORDER — TECHNETIUM TC 99M SULFUR COLLOID
2.0000 | Freq: Once | INTRAVENOUS | Status: AC | PRN
  Administered 2020-12-02: 2.14 via ORAL

## 2020-12-06 ENCOUNTER — Encounter: Payer: BC Managed Care – PPO | Admitting: Physical Therapy

## 2020-12-07 ENCOUNTER — Other Ambulatory Visit: Payer: Self-pay | Admitting: Family Medicine

## 2020-12-07 DIAGNOSIS — K2900 Acute gastritis without bleeding: Secondary | ICD-10-CM

## 2020-12-08 ENCOUNTER — Encounter: Payer: BC Managed Care – PPO | Admitting: Physical Therapy

## 2020-12-09 NOTE — Telephone Encounter (Signed)
Requested medication (s) are due for refill today: NO  Requested medication (s) are on the active medication list: NO  Last refill:  11/14/20  Future visit scheduled: NO  Notes to clinic:  Not on current med list, please assess.     Requested Prescriptions  Pending Prescriptions Disp Refills   pantoprazole (PROTONIX) 40 MG tablet [Pharmacy Med Name: PANTOPRAZOLE SOD DR 40 MG TAB] 30 tablet 0    Sig: TAKE 1 TABLET BY MOUTH EVERY DAY     Gastroenterology: Proton Pump Inhibitors Passed - 12/07/2020  2:34 PM      Passed - Valid encounter within last 12 months    Recent Outpatient Visits           3 weeks ago Acute gastritis, presence of bleeding unspecified, unspecified gastritis type   [facility] Jerrol Banana, MD   1 month ago Arthralgia of left hip   [facility] Jerrol Banana, MD       Future Appointments             In 2 weeks [name], MD [facility]

## 2020-12-12 ENCOUNTER — Other Ambulatory Visit: Payer: Self-pay | Admitting: Gastroenterology

## 2020-12-12 ENCOUNTER — Ambulatory Visit
Admission: RE | Admit: 2020-12-12 | Payer: BC Managed Care – PPO | Source: Home / Self Care | Admitting: Gastroenterology

## 2020-12-12 ENCOUNTER — Encounter: Admission: RE | Payer: Self-pay | Source: Home / Self Care

## 2020-12-12 SURGERY — ESOPHAGOGASTRODUODENOSCOPY (EGD) WITH PROPOFOL
Anesthesia: General

## 2020-12-13 ENCOUNTER — Encounter: Payer: BC Managed Care – PPO | Admitting: Physical Therapy

## 2020-12-13 DIAGNOSIS — N926 Irregular menstruation, unspecified: Secondary | ICD-10-CM | POA: Diagnosis not present

## 2020-12-13 DIAGNOSIS — Z794 Long term (current) use of insulin: Secondary | ICD-10-CM | POA: Diagnosis not present

## 2020-12-13 DIAGNOSIS — E119 Type 2 diabetes mellitus without complications: Secondary | ICD-10-CM | POA: Diagnosis not present

## 2020-12-15 ENCOUNTER — Encounter: Payer: BC Managed Care – PPO | Admitting: Physical Therapy

## 2020-12-21 ENCOUNTER — Ambulatory Visit: Payer: BC Managed Care – PPO | Admitting: Advanced Practice Midwife

## 2020-12-26 DIAGNOSIS — E28319 Asymptomatic premature menopause: Secondary | ICD-10-CM | POA: Diagnosis not present

## 2020-12-26 DIAGNOSIS — E669 Obesity, unspecified: Secondary | ICD-10-CM | POA: Diagnosis not present

## 2020-12-26 DIAGNOSIS — Z978 Presence of other specified devices: Secondary | ICD-10-CM | POA: Diagnosis not present

## 2020-12-26 DIAGNOSIS — E119 Type 2 diabetes mellitus without complications: Secondary | ICD-10-CM | POA: Diagnosis not present

## 2020-12-27 ENCOUNTER — Ambulatory Visit: Payer: BC Managed Care – PPO | Admitting: Gastroenterology

## 2021-01-11 ENCOUNTER — Ambulatory Visit: Payer: Self-pay | Admitting: Family Medicine

## 2021-01-30 DIAGNOSIS — E119 Type 2 diabetes mellitus without complications: Secondary | ICD-10-CM | POA: Diagnosis not present

## 2021-01-30 DIAGNOSIS — Z978 Presence of other specified devices: Secondary | ICD-10-CM | POA: Diagnosis not present

## 2021-01-30 DIAGNOSIS — Z794 Long term (current) use of insulin: Secondary | ICD-10-CM | POA: Insufficient documentation

## 2021-01-30 DIAGNOSIS — E669 Obesity, unspecified: Secondary | ICD-10-CM | POA: Insufficient documentation

## 2021-01-30 DIAGNOSIS — N912 Amenorrhea, unspecified: Secondary | ICD-10-CM | POA: Diagnosis not present

## 2021-02-02 ENCOUNTER — Other Ambulatory Visit: Payer: Self-pay

## 2021-02-02 ENCOUNTER — Ambulatory Visit (INDEPENDENT_AMBULATORY_CARE_PROVIDER_SITE_OTHER): Payer: BC Managed Care – PPO

## 2021-02-02 ENCOUNTER — Ambulatory Visit
Admission: RE | Admit: 2021-02-02 | Discharge: 2021-02-02 | Disposition: A | Discharge status: Home / Self Care | Payer: BC Managed Care – PPO | Source: Ambulatory Visit

## 2021-02-02 VITALS — BP 114/80 | HR 94 | Temp 98.2°F | Resp 18 | Ht 65.0 in | Wt 205.0 lb

## 2021-02-02 DIAGNOSIS — S8001XA Contusion of right knee, initial encounter: Secondary | ICD-10-CM

## 2021-02-02 DIAGNOSIS — S96911A Strain of unspecified muscle and tendon at ankle and foot level, right foot, initial encounter: Secondary | ICD-10-CM

## 2021-02-02 DIAGNOSIS — M25571 Pain in right ankle and joints of right foot: Secondary | ICD-10-CM

## 2021-02-02 DIAGNOSIS — S7001XA Contusion of right hip, initial encounter: Secondary | ICD-10-CM | POA: Diagnosis not present

## 2021-02-02 DIAGNOSIS — W19XXXA Unspecified fall, initial encounter: Secondary | ICD-10-CM | POA: Diagnosis not present

## 2021-02-02 DIAGNOSIS — S90121A Contusion of right lesser toe(s) without damage to nail, initial encounter: Secondary | ICD-10-CM

## 2021-02-02 DIAGNOSIS — Z043 Encounter for examination and observation following other accident: Secondary | ICD-10-CM | POA: Diagnosis not present

## 2021-02-02 NOTE — ED Triage Notes (Addendum)
Pt c/o fall last night around 11pm. Pt states that she was trying to separate her 2 dogs from fighting and slipped on a blanket. Pt states that when she sits on her bed and hangs her feet off the bed, her right foot turns blue in color.  Pt states that her right knee, ankle, and toe are hurting. Pain is greater in the Ankle but she states that her knee is "throwing everything off". Pt had hip surgery on the right side recently and feels that she is "babying this side"

## 2021-02-02 NOTE — Discharge Instructions (Addendum)
Applying a cloth over painful areas apply ice for 15 minutes 3-4 times today and part of tomorrow. You may take Ibuprofen up to 800 mg every 8 hours for pain, and may add Tylenol between those doses if need more pain relief. Follow up with your PCP or orthopedist next week.

## 2021-02-02 NOTE — ED Provider Notes (Addendum)
[facility]    CSN: [id number] Arrival date & time: 02/02/21  1446      History   Chief Complaint Chief Complaint  Patient presents with   Fall    Appt @ 3    HPI Debbie Travis is a 36 y.o. female who presents with R knee, ankle and toes pain since she fell on her R knee last night when she was trying to separate 2 of her dogs. Today she noticed when her legs are hanging that her R foot color is not the same and her foot feels cold. Her knee fee and R hip feel just sore. Her second toe looked dislocated last night and her father straightened it out. Has pain on her proximal 4th toe as well. She had labrum tear surgery on R hip 07/2020.     Past Medical History:  Diagnosis Date   Diabetes mellitus without complication (HCC)    Hip impingement syndrome, right    Labral tear of right hip joint    Mitral valve prolapse     Patient Active Problem List   Diagnosis Date Noted   Acute gastritis 11/14/2020   Arthralgia of left hip 11/14/2020   Pain of right hip joint 10/25/2020   Left lumbosacral radiculopathy 10/25/2020   Diabetes mellitus without complication (HCC) 10/25/2020   Amenorrhea 10/25/2020   Hip impingement syndrome, right 10/25/2020   Labral tear of right hip joint 10/25/2020    Past Surgical History:  Procedure Laterality Date   CHOLECYSTECTOMY  03/2003   HIP SURGERY Right 07/12/2020   femoral acetabullar impingement   LEG TENDON SURGERY Left 2004   TONSILLECTOMY Bilateral 09/2007   WISDOM TOOTH EXTRACTION Left 05/2020    OB History   No obstetric history on file.      Home Medications    Prior to Admission medications   Medication Sig Start Date End Date Taking? Authorizing Provider  gabapentin (NEURONTIN) 300 MG capsule Take 300 mg by mouth at bedtime.   Yes [provider]  Insulin Regular Human (HUMULIN R U-500 KWIKPEN [place]) Inject 25-50 Units into the skin in the morning, at noon, and at bedtime.   Yes [provider]  omeprazole (PRILOSEC) 40 MG capsule TAKE 1 CAPSULE (40 MG TOTAL) BY MOUTH IN THE MORNING AND AT BEDTIME. 12/12/20  Yes [name], MD  promethazine (PHENERGAN) 25 MG tablet Take 1 tablet (25 mg total) by mouth every 6 (six) hours as needed for nausea or vomiting. 11/14/20  Yes Jerrol Banana, MD  OZEMPIC, 0.25 OR 0.5 MG/DOSE, 2 MG/1.5ML SOPN Inject into the skin. 01/03/21   [provider]    Family History Family History  Problem Relation Age of Onset   Anxiety disorder Mother    Diabetes Mother    Cancer Mother    Appendicitis Mother    Anxiety disorder Brother    Autism Brother    Anxiety disorder Brother    Bipolar disorder Brother    Anxiety disorder Son    Other Son        Hypogammaglobulinemia   Diabetes Maternal Grandmother    Heart attack Maternal Grandmother    Diabetes Maternal Grandfather    Heart attack Maternal Grandfather    Diabetes Paternal Grandmother    Heart attack Paternal Grandmother    Diabetes Paternal Grandfather    Heart attack Paternal Grandfather     Social History Social History   Tobacco Use   Smoking status: Never  Smokeless tobacco: Never  Vaping Use   Vaping Use: Never used  Substance Use Topics   Alcohol use: Not Currently   Drug use: Never     Allergies   Citrus, Penicillins, Shellfish allergy, and Codeine   Review of Systems Review of Systems  Cardiovascular:  Negative for leg swelling.  Musculoskeletal:  Positive for arthralgias. Negative for back pain.  Skin:  Positive for color change. Negative for rash and wound.  Neurological:  Positive for numbness.       On R lateral ankle    Physical Exam Triage Vital Signs ED Triage Vitals  Enc Vitals Group     BP 02/02/21 1457 114/80     Pulse Rate 02/02/21 1457 94     Resp 02/02/21 1457 18     Temp 02/02/21 1457 98.2 F (36.8 C)     Temp Source 02/02/21 1457 Oral     SpO2 02/02/21 1457 98 %     Weight 02/02/21 1456 205 lb (93 kg)     Height  02/02/21 1456 5\' 5"  (1.651 m)     Head Circumference --      Peak Flow --      Pain Score 02/02/21 1455 9     Pain Loc --      Pain Edu? --      Excl. in GC? --    No data found.  Updated Vital Signs BP 114/80 (BP Location: Left Arm)    Pulse 94    Temp 98.2 F (36.8 C) (Oral)    Resp 18    Ht 5\' 5"  (1.651 m)    Wt 205 lb (93 kg)    LMP  (LMP Unknown)    SpO2 98%    BMI 34.11 kg/m   Visual Acuity Right Eye Distance:   Left Eye Distance:   Bilateral Distance:    Right Eye Near:   Left Eye Near:    Bilateral Near:     Physical Exam Vitals and nursing note reviewed.  Constitutional:      General: She is not in acute distress.    Appearance: She is obese. She is not toxic-appearing.  HENT:     Right Ear: External ear normal.     Left Ear: External ear normal.  Eyes:     General: No scleral icterus.    Conjunctiva/sclera: Conjunctivae normal.  Cardiovascular:     Pulses: Normal pulses.  Pulmonary:     Effort: Pulmonary effort is normal.  Musculoskeletal:     Cervical back: Neck supple.     Comments: R ANKLE- with moderate tenderness behind lateral malleolus and mild on lateral malleolus. ROM decreased due to pain. Thompson test is neg.  R FOOT- is cool to palpation, has tender 2nd and 4th toes. Pulses are +2/4.  R HIP- no ecchymosis or swelling noted, but she is very tender on lateral R hip. ROM provoked R groin pain  Skin:    General: Skin is warm and dry.     Capillary Refill: Capillary refill takes less than 2 seconds.     Findings: No rash.     Comments: Has faint bruise on center of her R knee over the patella. ROM of knee is normal with mild pain. No effusion noted. Also has faint bruising on proximal 2nd and 4th toes.  While her legs were down sitting on a chair of on the exam table, R food did not turn purple. Capillary refill was symmetric.   Neurological:  Mental Status: She is alert and oriented to person, place, and time.     Gait: Gait normal.   Psychiatric:        Mood and Affect: Mood normal.        Behavior: Behavior normal.        Thought Content: Thought content normal.        Judgment: Judgment normal.     UC Treatments / Results  Labs (all labs ordered are listed, but only abnormal results are displayed) Labs Reviewed - No data to display  EKG   Radiology DG Ankle Complete Right  Result Date: 02/02/2021 CLINICAL DATA:  Status post fall in the foot.  Ankle pain. EXAM: RIGHT ANKLE - COMPLETE 3+ VIEW; RIGHT FOOT COMPLETE - 3+ VIEW COMPARISON:  None. FINDINGS: No acute fracture or dislocation. No aggressive osseous lesion. Normal alignment. Soft tissue are unremarkable. No radiopaque foreign body or soft tissue emphysema. IMPRESSION: 1.  No acute osseous injury of the right ankle. 2.  No acute osseous injury of the right foot. Electronically Signed   By: Elige Ko M.D.   On: 02/02/2021 16:49   DG Foot Complete Right  Result Date: 02/02/2021 CLINICAL DATA:  Status post fall in the foot.  Ankle pain. EXAM: RIGHT ANKLE - COMPLETE 3+ VIEW; RIGHT FOOT COMPLETE - 3+ VIEW COMPARISON:  None. FINDINGS: No acute fracture or dislocation. No aggressive osseous lesion. Normal alignment. Soft tissue are unremarkable. No radiopaque foreign body or soft tissue emphysema. IMPRESSION: 1.  No acute osseous injury of the right ankle. 2.  No acute osseous injury of the right foot. Electronically Signed   By: Elige Ko M.D.   On: 02/02/2021 16:49   DG Hip Unilat W or Wo Pelvis 2-3 Views Right  Result Date: 02/02/2021 CLINICAL DATA:  Fall EXAM: DG HIP (WITH OR WITHOUT PELVIS) 3V RIGHT COMPARISON:  None. FINDINGS: There is no evidence of hip fracture or dislocation. There is no evidence of arthropathy or other focal bone abnormality. IMPRESSION: Negative. Electronically Signed   By: Allegra Lai M.D.   On: 02/02/2021 16:49    Procedures Procedures (including critical care time)  Medications Ordered in UC Medications - No data to  display  Initial Impression / Assessment and Plan / UC Course  I have reviewed the triage vital signs and the nursing notes. Pertinent  imaging results that were available during my care of the patient were reviewed by me and considered in my medical decision making (see chart for details). Has contusion of R hip, R knee, strain R ankle and contusion R foot 2nd and 4th toes.  See instructions.     Final Clinical Impressions(s) / UC Diagnoses   Final diagnoses:  Contusion of right knee, initial encounter  Ankle strain, right, initial encounter  Contusion of toe of right foot, unspecified toe, initial encounter  Contusion of right hip, initial encounter     Discharge Instructions      Applying a cloth over painful areas apply ice for 15 minutes 3-4 times today and part of tomorrow. You may take Ibuprofen up to 800 mg every 8 hours for pain, and may add Tylenol between those doses if need more pain relief. Follow up with your PCP or orthopedist next week.       ED Prescriptions   None    I have reviewed the PDMP during this encounter.   Garey Ham, PA-C 02/02/21 1743    Rodriguez[place], [place], PA-C 02/02/21 1746

## 2021-02-22 ENCOUNTER — Other Ambulatory Visit: Payer: Self-pay

## 2021-02-23 ENCOUNTER — Ambulatory Visit: Payer: Self-pay | Admitting: Gastroenterology

## 2021-03-01 ENCOUNTER — Other Ambulatory Visit: Payer: Self-pay

## 2021-03-01 ENCOUNTER — Ambulatory Visit
Admission: EM | Admit: 2021-03-01 | Discharge: 2021-03-01 | Disposition: A | Discharge status: Home / Self Care | Payer: BC Managed Care – PPO | Attending: Internal Medicine | Admitting: Internal Medicine

## 2021-03-01 DIAGNOSIS — Z20822 Contact with and (suspected) exposure to covid-19: Secondary | ICD-10-CM | POA: Diagnosis not present

## 2021-03-01 NOTE — ED Provider Notes (Signed)
[facility]    CSN: [id number] Arrival date & time: 03/01/21  1448      History   Chief Complaint Chief Complaint  Patient presents with   Covid Exposure    HPI Debbie Travis is a 36 y.o. female comes to urgent care for COVID testing.  Patient has no symptoms but was exposed to a COVID-19 positive family member.Marland Kitchen   HPI  Past Medical History:  Diagnosis Date   Diabetes mellitus without complication (HCC)    Hip impingement syndrome, right    Labral tear of right hip joint    Mitral valve prolapse     Patient Active Problem List   Diagnosis Date Noted   Insulin long-term use (HCC) 01/30/2021   Obesity 01/30/2021   Uses self-applied continuous glucose monitoring device 01/30/2021   Acute gastritis 11/14/2020   Arthralgia of left hip 11/14/2020   Pain of right hip joint 10/25/2020   Left lumbosacral radiculopathy 10/25/2020   Diabetes mellitus without complication (HCC) 10/25/2020   Amenorrhea 10/25/2020   Hip impingement syndrome, right 10/25/2020   Labral tear of right hip joint 10/25/2020    Past Surgical History:  Procedure Laterality Date   CHOLECYSTECTOMY  03/2003   HIP SURGERY Right 07/12/2020   femoral acetabullar impingement   LEG TENDON SURGERY Left 2004   TONSILLECTOMY Bilateral 09/2007   WISDOM TOOTH EXTRACTION Left 05/2020    OB History   No obstetric history on file.      Home Medications    Prior to Admission medications   Medication Sig Start Date End Date Taking? Authorizing Provider  ergocalciferol (VITAMIN D2) 1.25 MG (50000 UT) capsule Take by mouth. 12/20/20 03/20/21  [provider]  gabapentin (NEURONTIN) 300 MG capsule Take by mouth.    [provider]  Insulin Regular Human (HUMULIN R U-500 KWIKPEN [place]) Inject 25-50 Units into the skin in the morning, at noon, and at bedtime.    [provider]  JARDIANCE 10 MG TABS tablet Take 10 mg by mouth daily. 01/03/21   [provider]   medroxyPROGESTERone (PROVERA) 10 MG tablet Take 1 tablet by mouth daily. 01/30/21 01/30/22  [provider]  omeprazole (PRILOSEC) 40 MG capsule TAKE 1 CAPSULE (40 MG TOTAL) BY MOUTH IN THE MORNING AND AT BEDTIME. 12/12/20   [name], MD  OZEMPIC, 0.25 OR 0.5 MG/DOSE, 2 MG/1.5ML SOPN Inject into the skin. 01/03/21   [provider]  promethazine (PHENERGAN) 25 MG tablet Take 1 tablet (25 mg total) by mouth every 6 (six) hours as needed for nausea or vomiting. 11/14/20   Jerrol Banana, MD    Family History Family History  Problem Relation Age of Onset   Anxiety disorder Mother    Diabetes Mother    Cancer Mother    Appendicitis Mother    Anxiety disorder Brother    Autism Brother    Anxiety disorder Brother    Bipolar disorder Brother    Anxiety disorder Son    Other Son        Hypogammaglobulinemia   Diabetes Maternal Grandmother    Heart attack Maternal Grandmother    Diabetes Maternal Grandfather    Heart attack Maternal Grandfather    Diabetes Paternal Grandmother    Heart attack Paternal Grandmother    Diabetes Paternal Grandfather    Heart attack Paternal Grandfather     Social History Social History   Tobacco Use   Smoking status: Never   Smokeless tobacco: Never  Vaping Use   Vaping Use: Never used  Substance Use Topics   Alcohol use: Not Currently   Drug use: Never     Allergies   Citrus, Penicillins, Shellfish allergy, and Codeine   Review of Systems Review of Systems  All other systems reviewed and are negative.   Physical Exam Triage Vital Signs ED Triage Vitals [03/01/21 1508]  Enc Vitals Group     BP 106/74     Pulse Rate 81     Resp 16     Temp 98.3 F (36.8 C)     Temp Source Oral     SpO2 99 %     Weight 200 lb (90.7 kg)     Height 5\' 5"  (1.651 m)     Head Circumference      Peak Flow      Pain Score 0     Pain Loc      Pain Edu?      Excl. in GC?    No data found.  Updated Vital Signs BP 106/74     Pulse 81    Temp 98.3 F (36.8 C) (Oral)    Resp 16    Ht 5\' 5"  (1.651 m)    Wt 90.7 kg    LMP  (LMP Unknown)    SpO2 99%    BMI 33.28 kg/m   Visual Acuity Right Eye Distance:   Left Eye Distance:   Bilateral Distance:    Right Eye Near:   Left Eye Near:    Bilateral Near:     Physical Exam Vitals and nursing note reviewed.  Constitutional:      General: She is not in acute distress.    Appearance: Normal appearance. She is not ill-appearing.  Neurological:     General: No focal deficit present.     Mental Status: She is alert and oriented to person, place, and time.     UC Treatments / Results  Labs (all labs ordered are listed, but only abnormal results are displayed) Labs Reviewed  SARS CORONAVIRUS 2 (TAT 6-24 HRS)    EKG   Radiology No results found.  Procedures Procedures (including critical care time)  Medications Ordered in UC Medications - No data to display  Initial Impression / Assessment and Plan / UC Course  I have reviewed the triage vital signs and the nursing notes.  Pertinent labs & imaging results that were available during my care of the patient were reviewed by me and considered in my medical decision making (see chart for details).     1.  Close exposure to COVID-19 virus: COVID-19 PCR test has been sent Patient is advised to quarantine until COVID-19 test results are available Maintain adequate hydration Patient is fully vaccinated against COVID-19 virus. Final Clinical Impressions(s) / UC Diagnoses   Final diagnoses:  Close exposure to COVID-19 virus     Discharge Instructions      Maintain adequate hydration If you have any symptoms please return to if you have any symptoms please quarantine and do at home test.  If the home COVID test is positive, please quarantine for 5 days.   ED Prescriptions   None    PDMP not reviewed this encounter.   , MD 03/01/21 [phone]

## 2021-03-01 NOTE — ED Triage Notes (Signed)
Pt here for covid test after an exposure. Denies having any symptoms.

## 2021-03-01 NOTE — Discharge Instructions (Addendum)
Maintain adequate hydration If you have any symptoms please return to if you have any symptoms please quarantine and do at home test.  If the home COVID test is positive, please quarantine for 5 days.

## 2021-03-02 LAB — SARS CORONAVIRUS 2 (TAT 6-24 HRS): SARS Coronavirus 2: NEGATIVE

## 2021-03-03 ENCOUNTER — Encounter: Payer: Self-pay | Admitting: Family Medicine

## 2021-03-21 DIAGNOSIS — Z6836 Body mass index (BMI) 36.0-36.9, adult: Secondary | ICD-10-CM | POA: Diagnosis not present

## 2021-03-21 DIAGNOSIS — N912 Amenorrhea, unspecified: Secondary | ICD-10-CM | POA: Diagnosis not present

## 2021-03-23 DIAGNOSIS — M9903 Segmental and somatic dysfunction of lumbar region: Secondary | ICD-10-CM | POA: Diagnosis not present

## 2021-03-23 DIAGNOSIS — M461 Sacroiliitis, not elsewhere classified: Secondary | ICD-10-CM | POA: Diagnosis not present

## 2021-03-23 DIAGNOSIS — M9905 Segmental and somatic dysfunction of pelvic region: Secondary | ICD-10-CM | POA: Diagnosis not present

## 2021-03-23 DIAGNOSIS — M545 Low back pain, unspecified: Secondary | ICD-10-CM | POA: Diagnosis not present

## 2021-03-27 DIAGNOSIS — M461 Sacroiliitis, not elsewhere classified: Secondary | ICD-10-CM | POA: Diagnosis not present

## 2021-03-27 DIAGNOSIS — M9905 Segmental and somatic dysfunction of pelvic region: Secondary | ICD-10-CM | POA: Diagnosis not present

## 2021-03-27 DIAGNOSIS — M9903 Segmental and somatic dysfunction of lumbar region: Secondary | ICD-10-CM | POA: Diagnosis not present

## 2021-03-27 DIAGNOSIS — M545 Low back pain, unspecified: Secondary | ICD-10-CM | POA: Diagnosis not present

## 2021-04-03 DIAGNOSIS — E119 Type 2 diabetes mellitus without complications: Secondary | ICD-10-CM | POA: Diagnosis not present

## 2021-04-03 DIAGNOSIS — M160 Bilateral primary osteoarthritis of hip: Secondary | ICD-10-CM | POA: Diagnosis not present

## 2021-04-03 DIAGNOSIS — Z794 Long term (current) use of insulin: Secondary | ICD-10-CM | POA: Diagnosis not present

## 2021-04-03 DIAGNOSIS — M533 Sacrococcygeal disorders, not elsewhere classified: Secondary | ICD-10-CM | POA: Diagnosis not present

## 2021-04-03 DIAGNOSIS — Q7649 Other congenital malformations of spine, not associated with scoliosis: Secondary | ICD-10-CM | POA: Diagnosis not present

## 2021-04-03 DIAGNOSIS — M25551 Pain in right hip: Secondary | ICD-10-CM | POA: Diagnosis not present

## 2021-04-03 DIAGNOSIS — D72829 Elevated white blood cell count, unspecified: Secondary | ICD-10-CM | POA: Diagnosis not present

## 2021-04-03 DIAGNOSIS — M7989 Other specified soft tissue disorders: Secondary | ICD-10-CM | POA: Diagnosis not present

## 2021-04-07 DIAGNOSIS — D72829 Elevated white blood cell count, unspecified: Secondary | ICD-10-CM | POA: Diagnosis not present

## 2021-04-07 DIAGNOSIS — M25551 Pain in right hip: Secondary | ICD-10-CM | POA: Diagnosis not present

## 2021-04-07 DIAGNOSIS — Z794 Long term (current) use of insulin: Secondary | ICD-10-CM | POA: Diagnosis not present

## 2021-04-07 DIAGNOSIS — M25451 Effusion, right hip: Secondary | ICD-10-CM | POA: Diagnosis not present

## 2021-04-07 DIAGNOSIS — Z9889 Other specified postprocedural states: Secondary | ICD-10-CM | POA: Diagnosis not present

## 2021-04-07 DIAGNOSIS — E1165 Type 2 diabetes mellitus with hyperglycemia: Secondary | ICD-10-CM | POA: Diagnosis not present

## 2021-04-07 DIAGNOSIS — E119 Type 2 diabetes mellitus without complications: Secondary | ICD-10-CM | POA: Diagnosis not present

## 2021-04-10 DIAGNOSIS — Z7984 Long term (current) use of oral hypoglycemic drugs: Secondary | ICD-10-CM | POA: Diagnosis not present

## 2021-04-10 DIAGNOSIS — Z880 Allergy status to penicillin: Secondary | ICD-10-CM | POA: Diagnosis not present

## 2021-04-10 DIAGNOSIS — M25451 Effusion, right hip: Secondary | ICD-10-CM | POA: Diagnosis not present

## 2021-04-10 DIAGNOSIS — Z794 Long term (current) use of insulin: Secondary | ICD-10-CM | POA: Diagnosis not present

## 2021-04-10 DIAGNOSIS — E119 Type 2 diabetes mellitus without complications: Secondary | ICD-10-CM | POA: Diagnosis not present

## 2021-04-10 DIAGNOSIS — Z79891 Long term (current) use of opiate analgesic: Secondary | ICD-10-CM | POA: Diagnosis not present

## 2021-04-10 DIAGNOSIS — Z79899 Other long term (current) drug therapy: Secondary | ICD-10-CM | POA: Diagnosis not present

## 2021-04-10 DIAGNOSIS — Z885 Allergy status to narcotic agent status: Secondary | ICD-10-CM | POA: Diagnosis not present

## 2021-04-10 DIAGNOSIS — M25551 Pain in right hip: Secondary | ICD-10-CM | POA: Diagnosis not present

## 2021-04-10 DIAGNOSIS — G8929 Other chronic pain: Secondary | ICD-10-CM | POA: Diagnosis not present

## 2021-04-10 DIAGNOSIS — Z6836 Body mass index (BMI) 36.0-36.9, adult: Secondary | ICD-10-CM | POA: Diagnosis not present

## 2021-04-25 DIAGNOSIS — M531 Cervicobrachial syndrome: Secondary | ICD-10-CM | POA: Diagnosis not present

## 2021-04-25 DIAGNOSIS — M9902 Segmental and somatic dysfunction of thoracic region: Secondary | ICD-10-CM | POA: Diagnosis not present

## 2021-04-25 DIAGNOSIS — M9901 Segmental and somatic dysfunction of cervical region: Secondary | ICD-10-CM | POA: Diagnosis not present

## 2021-04-25 DIAGNOSIS — M546 Pain in thoracic spine: Secondary | ICD-10-CM | POA: Diagnosis not present

## 2021-04-28 DIAGNOSIS — M25551 Pain in right hip: Secondary | ICD-10-CM | POA: Diagnosis not present

## 2021-05-01 ENCOUNTER — Encounter: Payer: BC Managed Care – PPO | Admitting: Family Medicine

## 2021-05-01 ENCOUNTER — Encounter: Payer: Self-pay | Admitting: Family Medicine

## 2021-05-01 NOTE — Progress Notes (Signed)
Patient did not keep appointment today. She may call to reschedule.  

## 2021-05-08 DIAGNOSIS — S73191D Other sprain of right hip, subsequent encounter: Secondary | ICD-10-CM | POA: Diagnosis not present

## 2021-05-08 DIAGNOSIS — M25551 Pain in right hip: Secondary | ICD-10-CM | POA: Diagnosis not present

## 2021-05-08 DIAGNOSIS — Z6836 Body mass index (BMI) 36.0-36.9, adult: Secondary | ICD-10-CM | POA: Diagnosis not present

## 2021-05-26 DIAGNOSIS — M25551 Pain in right hip: Secondary | ICD-10-CM | POA: Diagnosis not present

## 2021-06-08 DIAGNOSIS — M79671 Pain in right foot: Secondary | ICD-10-CM | POA: Insufficient documentation

## 2021-06-08 DIAGNOSIS — M79641 Pain in right hand: Secondary | ICD-10-CM | POA: Insufficient documentation

## 2021-06-08 DIAGNOSIS — S62141A Displaced fracture of body of hamate [unciform] bone, right wrist, initial encounter for closed fracture: Secondary | ICD-10-CM | POA: Insufficient documentation

## 2021-06-08 DIAGNOSIS — S62144A Nondisplaced fracture of body of hamate [unciform] bone, right wrist, initial encounter for closed fracture: Secondary | ICD-10-CM | POA: Diagnosis not present

## 2021-06-20 DIAGNOSIS — M25572 Pain in left ankle and joints of left foot: Secondary | ICD-10-CM | POA: Diagnosis not present

## 2021-06-20 DIAGNOSIS — S63501D Unspecified sprain of right wrist, subsequent encounter: Secondary | ICD-10-CM | POA: Diagnosis not present

## 2021-06-21 ENCOUNTER — Ambulatory Visit: Payer: BC Managed Care – PPO | Attending: Orthopedic Surgery | Admitting: Physical Therapy

## 2021-06-21 DIAGNOSIS — M25551 Pain in right hip: Secondary | ICD-10-CM | POA: Insufficient documentation

## 2021-06-21 DIAGNOSIS — M25552 Pain in left hip: Secondary | ICD-10-CM | POA: Insufficient documentation

## 2021-06-21 DIAGNOSIS — M6281 Muscle weakness (generalized): Secondary | ICD-10-CM | POA: Insufficient documentation

## 2021-06-28 ENCOUNTER — Encounter: Payer: BC Managed Care – PPO | Admitting: Physical Therapy

## 2021-06-29 ENCOUNTER — Encounter: Payer: BC Managed Care – PPO | Admitting: Physical Therapy

## 2021-06-30 DIAGNOSIS — M25531 Pain in right wrist: Secondary | ICD-10-CM | POA: Diagnosis not present

## 2021-06-30 DIAGNOSIS — M778 Other enthesopathies, not elsewhere classified: Secondary | ICD-10-CM | POA: Diagnosis not present

## 2021-06-30 DIAGNOSIS — M779 Enthesopathy, unspecified: Secondary | ICD-10-CM | POA: Diagnosis not present

## 2021-07-05 ENCOUNTER — Encounter: Payer: BC Managed Care – PPO | Admitting: Physical Therapy

## 2021-07-06 ENCOUNTER — Encounter: Payer: BC Managed Care – PPO | Admitting: Physical Therapy

## 2021-07-12 ENCOUNTER — Encounter: Payer: BC Managed Care – PPO | Admitting: Physical Therapy

## 2021-07-17 ENCOUNTER — Encounter: Payer: BC Managed Care – PPO | Admitting: Physical Therapy

## 2021-07-17 DIAGNOSIS — Z794 Long term (current) use of insulin: Secondary | ICD-10-CM | POA: Diagnosis not present

## 2021-07-17 DIAGNOSIS — E669 Obesity, unspecified: Secondary | ICD-10-CM | POA: Diagnosis not present

## 2021-07-17 DIAGNOSIS — N912 Amenorrhea, unspecified: Secondary | ICD-10-CM | POA: Diagnosis not present

## 2021-07-17 DIAGNOSIS — E119 Type 2 diabetes mellitus without complications: Secondary | ICD-10-CM | POA: Diagnosis not present

## 2021-07-17 LAB — HM DIABETES FOOT EXAM

## 2021-07-18 ENCOUNTER — Encounter: Payer: Self-pay | Admitting: Family Medicine

## 2021-07-18 ENCOUNTER — Ambulatory Visit
Admission: RE | Admit: 2021-07-18 | Discharge: 2021-07-18 | Disposition: A | Discharge status: Home / Self Care | Payer: BC Managed Care – PPO | Source: Ambulatory Visit | Attending: Family Medicine | Admitting: Family Medicine

## 2021-07-18 ENCOUNTER — Ambulatory Visit (INDEPENDENT_AMBULATORY_CARE_PROVIDER_SITE_OTHER): Payer: BC Managed Care – PPO | Admitting: Family Medicine

## 2021-07-18 VITALS — BP 118/78 | HR 78 | Ht 65.0 in | Wt 232.0 lb

## 2021-07-18 DIAGNOSIS — M2550 Pain in unspecified joint: Secondary | ICD-10-CM | POA: Diagnosis not present

## 2021-07-18 DIAGNOSIS — M79661 Pain in right lower leg: Secondary | ICD-10-CM | POA: Insufficient documentation

## 2021-07-18 DIAGNOSIS — R002 Palpitations: Secondary | ICD-10-CM

## 2021-07-18 DIAGNOSIS — M7989 Other specified soft tissue disorders: Secondary | ICD-10-CM | POA: Diagnosis not present

## 2021-07-18 NOTE — Assessment & Plan Note (Addendum)
Patient with right calf and foot swelling with mild Pain, discoloration and coolness compared to contralateral extremity ongoing for 1 week.  This in the setting of ipsilateral chronic right hip pain.  Denies any respiratory issues, does have long-term history of palpitations, unchanged.  Examination does reveal equivocal Homans, calf tenderness, minimal tenderness with deep palpation about the peroneals, sensorimotor intact, negative Thompson test.  Patient has been wells score of 3, plan to proceed with duplex venous ultrasound to evaluate for DVT.  If positive, will address accordingly, if negative symptoms can be considered a sequela of ipsilateral chronic hip pain and secondary compensation.  Recent outside endocrinology note reviewed.

## 2021-07-18 NOTE — Assessment & Plan Note (Addendum)
Has had elevated ESR and CRP in the past, this in the setting of diabetes mellitus and chronic right hip pain.  That being said patient is concerned about additional etiologies such as autoimmune, panel ordered, will follow results once available.  Outside rheumatology note reviewed.

## 2021-07-18 NOTE — Assessment & Plan Note (Signed)
Chronic issue for which she was treated with beta-block[place]  Self discontinued citing symptoms resolving.  Has had multiple (near annual) Holter studies.  She reports irregular heartbeat.  Today's cardiopulmonary exam reveals positive S1 and S2, regular rate and regular rhythm auscultated, no additional heart sounds are noted; lung fields are clear throughout without wheezes, rales, rhonchi.  We will hold from medication management at this stage, plan for referral to cardiology for this issue and risk stratification given comorbid diabetes mellitus.

## 2021-07-18 NOTE — Progress Notes (Signed)
Primary Care / Sports Medicine Office Visit  Patient Information:  Patient ID: Debbie Travis, female DOB: 08-15-1985 Age: 36 y.o. MRN: [id number]   Debbie Travis is a pleasant 36 y.o. female presenting with the following:  Chief Complaint  Patient presents with   Foot Swelling    Went to dr yesterday for right foot swelling, was told to come here for cardiology referral. Right foot swelling for 1 week, swells and turns blue at times    Vitals:   07/18/21 1039  BP: 118/78  Pulse: 78  SpO2: 98%   Vitals:   07/18/21 1039  Weight: 232 lb (105.2 kg)  Height: _0  (1.651 m)   Body mass index is 38.61 kg/m.  US Venous Img Lower Unilateral Right  Result Date: 07/18/2021 CLINICAL DATA:  Calf swelling, discoloration, temperature difference EXAM: RIGHT LOWER EXTREMITY VENOUS DOPPLER ULTRASOUND TECHNIQUE: Gray-scale sonography with compression, as well as color and duplex ultrasound, were performed to evaluate the deep venous system(s) from the level of the common femoral vein through the popliteal and proximal calf veins. COMPARISON:  None FINDINGS: VENOUS Normal compressibility of the common femoral, superficial femoral, and popliteal veins, as well as the visualized calf veins. Visualized portions of profunda femoral vein and great saphenous vein unremarkable. No filling defects to suggest DVT on grayscale or color Doppler imaging. Doppler waveforms show normal direction of venous flow, normal respiratory plasticity and response to augmentation. Limited views of the contralateral common femoral vein are unremarkable. OTHER None. Limitations: none IMPRESSION: No right lower extremity DVT Electronically Signed   By: Miachel Roux M.D.   On: 07/18/2021 15:11     Independent interpretation of notes and tests performed by another provider:   None  Procedures performed:   None  Pertinent History, Exam, Impression, and Recommendations:   Problem List Items Addressed This  Visit       Other   Right calf pain - Primary    Patient with right calf and foot swelling with mild Pain, discoloration and coolness compared to contralateral extremity ongoing for 1 week.  This in the setting of ipsilateral chronic right hip pain.  Denies any respiratory issues, does have long-term history of palpitations, unchanged.  Examination does reveal equivocal Homans, calf tenderness, minimal tenderness with deep palpation about the peroneals, sensorimotor intact, negative Thompson test.  Patient has been wells score of 3, plan to proceed with duplex venous ultrasound to evaluate for DVT.  If positive, will address accordingly, if negative symptoms can be considered a sequela of ipsilateral chronic hip pain and secondary compensation.  Recent outside endocrinology note reviewed.        Relevant Orders   US Venous Img Lower Unilateral Right (Completed)   Polyarthralgia    Has had elevated ESR and CRP in the past, this in the setting of diabetes mellitus and chronic right hip pain.  That being said patient is concerned about additional etiologies such as autoimmune, panel ordered, will follow results once available.  Outside rheumatology note reviewed.      Relevant Orders   ANA 12Plus Profile, Do All RDL   Palpitations    Chronic issue for which she was treated with beta-blocker in the [place]  Self discontinued citing symptoms resolving.  Has had multiple (near annual) Holter studies.  She reports irregular heartbeat.  Today's cardiopulmonary exam reveals positive S1 and S2, regular rate and regular rhythm auscultated, no additional heart sounds are noted; lung fields  are clear throughout without wheezes, rales, rhonchi.  We will hold from medication management at this stage, plan for referral to cardiology for this issue and risk stratification given comorbid diabetes mellitus.      Relevant Orders   Ambulatory referral to Cardiology     Orders &  Medications No orders of the defined types were placed in this encounter.  Orders Placed This Encounter  Procedures   US Venous Img Lower Unilateral Right   ANA 12Plus Profile, Do All RDL   Ambulatory referral to Cardiology     No follow-ups on file.     Montel Culver, MD   Primary Care Sports Medicine [place]

## 2021-07-21 ENCOUNTER — Encounter: Payer: Self-pay | Admitting: Family Medicine

## 2021-07-26 ENCOUNTER — Ambulatory Visit: Payer: Self-pay | Admitting: Family Medicine

## 2021-07-26 ENCOUNTER — Other Ambulatory Visit: Payer: Self-pay

## 2021-07-26 ENCOUNTER — Encounter: Payer: Self-pay | Admitting: Family Medicine

## 2021-07-26 DIAGNOSIS — M25551 Pain in right hip: Secondary | ICD-10-CM

## 2021-07-26 LAB — ANA 12PLUS PROFILE, DO ALL RDL
Anti-CCP Ab, IgG & IgA (RDL): <20 Units (ref <20)
Anti-Cardiolipin Ab, IgA (RDL): <12 APL U/mL (ref <12)
Anti-Cardiolipin Ab, IgG (RDL): <15 GPL U/mL (ref <15)
Anti-Cardiolipin Ab, IgM (RDL): <13 MPL U/mL (ref <13)
Anti-Centromere Ab (RDL): <1:40 {titer}
Anti-Chromatin Ab, IgG (RDL): <20 Units (ref <20)
Anti-La (SS-B) Ab (RDL): <20 Units (ref <20)
Anti-Nuclear Ab by IFA (RDL): POSITIVE — AB
Anti-Ro (SS-A) Ab (RDL): <20 Units (ref <20)
Anti-Scl-70 Ab (RDL): <20 Units (ref <20)
Anti-Sm Ab (RDL): <20 Units (ref <20)
Anti-TPO Ab (RDL): <9 IU/mL (ref <9.0)
Anti-U1 RNP Ab (RDL): <20 Units (ref <20)
Anti-dsDNA Ab by Farr(RDL): <8 IU/mL (ref <8.0)
C3 Complement (RDL): 198 mg/dL — ABNORMAL HIGH (ref 82–167)
C4 Complement (RDL): 40 mg/dL (ref 14–44)
Rheumatoid Factor by Turb RDL: <14 IU/mL (ref <14)

## 2021-07-26 LAB — ANA TITER AND PATTERN
Nuclear Dot Pattern: 1:80 {titer} — ABNORMAL HIGH
Speckled Pattern: 1:160 {titer} — ABNORMAL HIGH

## 2021-07-26 NOTE — Telephone Encounter (Signed)
Ok to place a referral?

## 2021-07-27 ENCOUNTER — Other Ambulatory Visit: Payer: Self-pay

## 2021-07-27 DIAGNOSIS — M25551 Pain in right hip: Secondary | ICD-10-CM

## 2021-08-04 DIAGNOSIS — G8929 Other chronic pain: Secondary | ICD-10-CM | POA: Diagnosis not present

## 2021-08-04 DIAGNOSIS — M25551 Pain in right hip: Secondary | ICD-10-CM | POA: Diagnosis not present

## 2021-08-04 DIAGNOSIS — M76891 Other specified enthesopathies of right lower limb, excluding foot: Secondary | ICD-10-CM | POA: Diagnosis not present

## 2021-08-08 DIAGNOSIS — M25551 Pain in right hip: Secondary | ICD-10-CM | POA: Diagnosis not present

## 2021-08-08 DIAGNOSIS — G8929 Other chronic pain: Secondary | ICD-10-CM | POA: Diagnosis not present

## 2021-08-14 ENCOUNTER — Ambulatory Visit: Admission: EM | Admit: 2021-08-14 | Payer: BC Managed Care – PPO

## 2021-08-14 ENCOUNTER — Other Ambulatory Visit: Payer: Self-pay

## 2021-08-14 ENCOUNTER — Emergency Department: Payer: BC Managed Care – PPO

## 2021-08-14 DIAGNOSIS — Z7984 Long term (current) use of oral hypoglycemic drugs: Secondary | ICD-10-CM | POA: Insufficient documentation

## 2021-08-14 DIAGNOSIS — Z20822 Contact with and (suspected) exposure to covid-19: Secondary | ICD-10-CM | POA: Insufficient documentation

## 2021-08-14 DIAGNOSIS — J9601 Acute respiratory failure with hypoxia: Secondary | ICD-10-CM | POA: Insufficient documentation

## 2021-08-14 DIAGNOSIS — R509 Fever, unspecified: Secondary | ICD-10-CM | POA: Diagnosis not present

## 2021-08-14 DIAGNOSIS — Z794 Long term (current) use of insulin: Secondary | ICD-10-CM | POA: Diagnosis not present

## 2021-08-14 DIAGNOSIS — J189 Pneumonia, unspecified organism: Principal | ICD-10-CM | POA: Insufficient documentation

## 2021-08-14 DIAGNOSIS — R079 Chest pain, unspecified: Secondary | ICD-10-CM | POA: Diagnosis not present

## 2021-08-14 DIAGNOSIS — E119 Type 2 diabetes mellitus without complications: Secondary | ICD-10-CM | POA: Diagnosis not present

## 2021-08-14 LAB — BASIC METABOLIC PANEL
Anion gap: 9 (ref 5–15)
BUN: <5 mg/dL — ABNORMAL LOW (ref 6–20)
CO2: 27 mmol/L (ref 22–32)
Calcium: 8.7 mg/dL — ABNORMAL LOW (ref 8.9–10.3)
Chloride: 106 mmol/L (ref 98–111)
Creatinine, Ser: 0.78 mg/dL (ref 0.44–1.00)
GFR, Estimated: >60 mL/min (ref >60)
Glucose, Bld: 169 mg/dL — ABNORMAL HIGH (ref 70–99)
Potassium: 3.5 mmol/L (ref 3.5–5.1)
Sodium: 142 mmol/L (ref 135–145)

## 2021-08-14 LAB — CBC
HCT: 39.7 % (ref 36.0–46.0)
Hemoglobin: 12.8 g/dL (ref 12.0–15.0)
MCH: 29 pg (ref 26.0–34.0)
MCHC: 32.2 g/dL (ref 30.0–36.0)
MCV: 90 fL (ref 80.0–100.0)
Platelets: 335 10*3/uL (ref 150–400)
RBC: 4.41 MIL/uL (ref 3.87–5.11)
RDW: 13.2 % (ref 11.5–15.5)
WBC: 9.4 10*3/uL (ref 4.0–10.5)
nRBC: 0 % (ref 0.0–0.2)

## 2021-08-14 LAB — TROPONIN I (HIGH SENSITIVITY): Troponin I (High Sensitivity): <2 ng/L (ref <18)

## 2021-08-14 LAB — SARS CORONAVIRUS 2 BY RT PCR: SARS Coronavirus 2 by RT PCR: NEGATIVE

## 2021-08-14 NOTE — ED Triage Notes (Signed)
Pt presents to ER with c/o burning in chest and fever.  Pt states her son has covid at home, and pt tested negative for covid via at home test today.  Pt denies n/v/d, or sob.  Pt endorses some fatigue, and states she had fever of 104 last night.  Pt took 600 mg motrin pta.  Pt ambulatory to triage, in NAD and A&O x4.

## 2021-08-14 NOTE — ED Provider Triage Note (Signed)
  Emergency Medicine Provider Triage Evaluation Note  Debbie Travis , a 36 y.o.female,  was evaluated in triage.  Pt complains of chest pain and fever.  She states that her son has COVID at home and she felt that she had COVID 2, however her COVID test was negative today.  Endorses some fatigue as well.  She states that her fever was as high as 104 last night.   Review of Systems  Positive: Chest pain, fever Negative: Denies abdominal pain, dysuria, vomiting  Physical Exam   Vitals:   08/14/21 1951  BP: 126/76  Pulse: 94  Resp: 20  Temp: 99.4 F (37.4 C)  SpO2: 96%   Gen:   Awake, no distress   Resp:  Normal effort  MSK:   Moves extremities without difficulty  Other:    Medical Decision Making  Given the patient's initial medical screening exam, the following diagnostic evaluation has been ordered. The patient will be placed in the appropriate treatment space, once one is available, to complete the evaluation and treatment. I have discussed the plan of care with the patient and I have advised the patient that an ED physician or mid-level practitioner will reevaluate their condition after the test results have been received, as the results may give them additional insight into the type of treatment they may need.    Diagnostics: Labs, EKG, respiratory panel, CXR  Treatments: none immediately   Varney Daily, [place] 08/14/21 2003

## 2021-08-15 ENCOUNTER — Observation Stay
Admission: EM | Admit: 2021-08-15 | Discharge: 2021-08-15 | Discharge status: Against Medical Advice | Payer: BC Managed Care – PPO | Attending: Emergency Medicine | Admitting: Emergency Medicine

## 2021-08-15 DIAGNOSIS — J189 Pneumonia, unspecified organism: Secondary | ICD-10-CM | POA: Diagnosis present

## 2021-08-15 DIAGNOSIS — E119 Type 2 diabetes mellitus without complications: Secondary | ICD-10-CM

## 2021-08-15 DIAGNOSIS — J9601 Acute respiratory failure with hypoxia: Secondary | ICD-10-CM

## 2021-08-15 DIAGNOSIS — R0902 Hypoxemia: Secondary | ICD-10-CM

## 2021-08-15 LAB — RESPIRATORY PANEL BY PCR

## 2021-08-15 LAB — LACTIC ACID, PLASMA: Lactic Acid, Venous: 1.2 mmol/L (ref 0.5–1.9)

## 2021-08-15 LAB — PROCALCITONIN: Procalcitonin: <0.1 ng/mL

## 2021-08-15 LAB — TROPONIN I (HIGH SENSITIVITY): Troponin I (High Sensitivity): <2 ng/L (ref <18)

## 2021-08-15 LAB — GROUP A STREP BY PCR: Group A Strep by PCR: NOT DETECTED

## 2021-08-15 MED ORDER — INSULIN ASPART 100 UNIT/ML IJ SOLN: 0.0000 [IU] | Freq: Every day | INTRAMUSCULAR | Status: DC

## 2021-08-15 MED ORDER — INSULIN ASPART 100 UNIT/ML IJ SOLN: 0.0000 [IU] | Freq: Three times a day (TID) | INTRAMUSCULAR | Status: DC

## 2021-08-15 MED ORDER — HYDROCODONE-ACETAMINOPHEN 5-325 MG PO TABS: 1.0000 | ORAL_TABLET | ORAL | Status: DC | PRN

## 2021-08-15 MED ORDER — ENOXAPARIN SODIUM 60 MG/0.6ML IJ SOSY
0.5000 mg/kg | PREFILLED_SYRINGE | INTRAMUSCULAR | Status: DC
Start: 2021-08-15 — End: 2021-08-15

## 2021-08-15 MED ORDER — ONDANSETRON HCL 4 MG PO TABS
4.0000 mg | ORAL_TABLET | Freq: Four times a day (QID) | ORAL | Status: DC | PRN
  Administered 2021-08-15: 4 mg via ORAL
  Filled 2021-08-15: qty 1

## 2021-08-15 MED ORDER — SODIUM CHLORIDE 0.9 % IV SOLN
100.0000 mg | Freq: Two times a day (BID) | INTRAVENOUS | Status: DC
  Administered 2021-08-15: 100 mg via INTRAVENOUS
  Filled 2021-08-15 (×2): qty 100

## 2021-08-15 MED ORDER — KETOROLAC TROMETHAMINE 30 MG/ML IJ SOLN
30.0000 mg | Freq: Once | INTRAMUSCULAR | Status: AC
  Administered 2021-08-15: 30 mg via INTRAVENOUS
  Filled 2021-08-15: qty 1

## 2021-08-15 MED ORDER — NIRMATRELVIR/RITONAVIR (PAXLOVID)TABLET
3.0000 | ORAL_TABLET | Freq: Two times a day (BID) | ORAL | Status: DC
  Administered 2021-08-15: 3 via ORAL
  Filled 2021-08-15: qty 30

## 2021-08-15 MED ORDER — AZITHROMYCIN 250 MG PO TABS: 500.0000 mg | ORAL_TABLET | Freq: Every day | ORAL | Status: DC

## 2021-08-15 MED ORDER — ACETAMINOPHEN 650 MG RE SUPP: 650.0000 mg | Freq: Four times a day (QID) | RECTAL | Status: DC | PRN

## 2021-08-15 MED ORDER — ONDANSETRON HCL 4 MG/2ML IJ SOLN: 4.0000 mg | Freq: Four times a day (QID) | INTRAMUSCULAR | Status: DC | PRN

## 2021-08-15 MED ORDER — ACETAMINOPHEN 325 MG PO TABS
650.0000 mg | ORAL_TABLET | Freq: Four times a day (QID) | ORAL | Status: DC | PRN
  Administered 2021-08-15: 650 mg via ORAL
  Filled 2021-08-15: qty 2

## 2021-08-15 MED ORDER — MORPHINE SULFATE (PF) 2 MG/ML IV SOLN: 2.0000 mg | INTRAVENOUS | Status: DC | PRN

## 2021-08-15 MED ORDER — SODIUM CHLORIDE 0.9 % IV SOLN: INTRAVENOUS | Status: DC

## 2021-08-15 NOTE — Assessment & Plan Note (Signed)
Hypoxia Normal WBC and procalcitonin less than 0.1 Follow respiratory viral panel Patient was started on Paxlovid from the ED as her son is currently ill and has COVID.  Patient's PCR was negative Will await respiratory viral panel Supplemental O2, antitussives and supportive care

## 2021-08-15 NOTE — Assessment & Plan Note (Signed)
Sliding scale insulin coverage 

## 2021-08-15 NOTE — Progress Notes (Signed)
Pt was admitted to the floor and told RN she would not be taking her paxlovid/refused glucose monitor.

## 2021-08-15 NOTE — Progress Notes (Addendum)
Provider Notification: Calvert Cantor MD   Pt pulled IV out and she is leaving AMA. Charge nurse gave papers to pt to sign.

## 2021-08-15 NOTE — Discharge Summary (Signed)
Physician Discharge Summary  Debbie Travis ZOX:[id number] DOB: 1985-12-11 DOA: 08/15/2021  PCP: Jerrol Banana, MD  Admit date: 08/15/2021 Discharge date: 08/15/2021 Discharging to: Home  The patient left AGAINST MEDICAL ADVICE before I had a chance to speak with her today  Discharge Diagnoses:   Principal Problem:   Bilateral pneumonia Active Problems:   Hypoxia   Type 2 diabetes mellitus without complication (HCC)    Please see full H&P Hospital Course:  The patient is a 36 year old female with type 2 diabetes mellitus who presented to the hospital with cough chest pain and fever.  Her son has COVID.  She tested negative for COVID.  Tmax was 104 degrees at home.  In the ED pulse ox dropped to 89% on room air. Chest x-ray revealed bilateral airspace opacity concerning for infection  Principal Problem:   Bilateral pneumonia -Respiratory panel noted to be positive for mycoplasma - Switched to azithromycin but she left before I was able to speak with her or prescribe medications for her - COVID-negative as mentioned above  Active Problems:   Hypoxia-related to above infection    Type 2 diabetes mellitus without complication (HCC)    Body mass index is 36.61 kg/m. Nutrition Status:          Discharge Instructions   Allergies as of 08/15/2021       Reactions   Citrus Swelling, Rash   Egg Solids, Whole Dermatitis, Diarrhea, Hives, Itching, Nausea And Vomiting, Palpitations, Rash, Swelling   Penicillins Anaphylaxis, Rash   Shellfish Allergy Swelling   Codeine Nausea And Vomiting        Medication List     ASK your doctor about these medications    FreeStyle Libre 3 Sensor Misc 1 each by Miscellaneous route every fourteen (14) days.   HUMULIN R U-500 KWIKPEN [place] Inject 25-50 Units into the skin in the morning, at noon, and at bedtime. 30 units in the am and sliding scale at lunch and dinner   Jardiance 10 MG Tabs tablet Generic drug: empagliflozin Take  10 mg by mouth daily.   Jardiance 25 MG Tabs tablet Generic drug: empagliflozin Take 25 mg by mouth daily.   melatonin 5 MG Tabs Take 5 mg by mouth at bedtime as needed.   Ozempic (0.25 or 0.5 MG/DOSE) 2 MG/1.5ML Sopn Generic drug: Semaglutide(0.25 or 0.5MG /DOS) Inject into the skin.   Ozempic (1 MG/DOSE) 4 MG/3ML Sopn Generic drug: Semaglutide (1 MG/DOSE) Inject 4 mg into the skin once a week.   progesterone 100 MG capsule Commonly known as: PROMETRIUM Take 100 mg by mouth at bedtime.   Toprol XL 50 MG 24 hr tablet Generic drug: metoprolol succinate Take 50 mg by mouth daily.            The results of significant diagnostics from this hospitalization (including imaging, microbiology, ancillary and laboratory) are listed below for reference.    DG Chest 2 View  Result Date: 08/14/2021 CLINICAL DATA:  Chest pain EXAM: CHEST - 2 VIEW COMPARISON:  None Available. FINDINGS: Heart size and mediastinal contours are within normal limits. Central predominant bilateral airspace opacities, most likely in the right upper lobe, lingula and upper lobes. No pleural effusion or pneumothorax. IMPRESSION: Central predominant bilateral airspace opacities, concerning for infection. Electronically Signed   By: Allegra Lai M.D.   On: 08/14/2021 20:30   US Venous Img Lower Unilateral Right  Result Date: 07/18/2021 CLINICAL DATA:  Calf swelling, discoloration, temperature difference EXAM: RIGHT LOWER EXTREMITY VENOUS DOPPLER  ULTRASOUND TECHNIQUE: Gray-scale sonography with compression, as well as color and duplex ultrasound, were performed to evaluate the deep venous system(s) from the level of the common femoral vein through the popliteal and proximal calf veins. COMPARISON:  None FINDINGS: VENOUS Normal compressibility of the common femoral, superficial femoral, and popliteal veins, as well as the visualized calf veins. Visualized portions of profunda femoral vein and great saphenous vein  unremarkable. No filling defects to suggest DVT on grayscale or color Doppler imaging. Doppler waveforms show normal direction of venous flow, normal respiratory plasticity and response to augmentation. Limited views of the contralateral common femoral vein are unremarkable. OTHER None. Limitations: none IMPRESSION: No right lower extremity DVT Electronically Signed   By: Acquanetta Belling M.D.   On: 07/18/2021 15:11   Labs:   Basic Metabolic Panel: Recent Labs  Lab 08/14/21 1955  NA 142  K 3.5  CL 106  CO2 27  GLUCOSE 169*  BUN <5*  CREATININE 0.78  CALCIUM 8.7*     CBC: Recent Labs  Lab 08/14/21 1955  WBC 9.4  HGB 12.8  HCT 39.7  MCV 90.0  PLT 335         SIGNED:   Calvert Cantor, MD  [facility] 08/15/2021, 4:07 PM

## 2021-08-15 NOTE — Progress Notes (Signed)
PHARMACIST - PHYSICIAN COMMUNICATION  CONCERNING:  Enoxaparin (Lovenox) for DVT Prophylaxis    RECOMMENDATION: Patient was prescribed enoxaprin 40mg  q24 hours for VTE prophylaxis.   Filed Weights   08/14/21 1952  Weight: 99.8 kg (220 lb)    Body mass index is 36.61 kg/m.  Estimated Creatinine Clearance: 114.8 mL/min (by C-G formula based on SCr of 0.78 mg/dL).   Based on [facility] policy patient is candidate for enoxaparin 0.5mg /kg TBW SQ every 24 hours based on BMI being >30.  DESCRIPTION: Pharmacy has adjusted enoxaparin dose per [facility] policy.  Patient is now receiving enoxaparin 0.5 mg/kg every 24 hours   [facility], PharmD, [facility] 08/15/2021 2:04 AM

## 2021-08-15 NOTE — H&P (Signed)
History and Physical    Patient: Debbie Travis LYY:[id number] DOB: Aug 23, 1985 DOA: 08/15/2021 DOS: the patient was seen and examined on 08/15/2021 PCP: Jerrol Banana, MD  Patient coming from: Home  Chief Complaint:  Chief Complaint  Patient presents with   Chest Pain   Fever    HPI: Debbie Travis is a 36 y.o. female with medical history significant for type 2 diabetes who presents to the ED with cough, chest pain and fever.  Son at home currently has COVID.  Home COVID test was negative.  She denies shortness of breath, nausea or vomiting.  Tmax at home was 104 for which she took ibuprofen ED course and data review: Tmax 99.6 with pulse 95 respirations 20 BP 126/76.  Patient had desaturations to 89% on room air while in the ED and was placed on oxygen Labs CBC and BMP unremarkable and procalcitonin less than 0.1.  Lactic acid 1.2, troponin less than 2, COVID-negative, group A strep not detected.  Blood cultures pending. EKG personally viewed and interpreted showing NSR at 95.  Chest x-ray shows central predominant bilateral airspace opacities concerning for of infection Patient was treated with doxycycline in the ED but also started on Paxlovid due to exposure at home.  Started on IV fluids.  Admission requested.     Past Medical History:  Diagnosis Date   Diabetes mellitus without complication (HCC)    Hip impingement syndrome, right    Labral tear of right hip joint    Mitral valve prolapse    Past Surgical History:  Procedure Laterality Date   CHOLECYSTECTOMY  03/2003   HIP SURGERY Right 07/12/2020   femoral acetabullar impingement   LEG TENDON SURGERY Left 2004   TONSILLECTOMY Bilateral 09/2007   WISDOM TOOTH EXTRACTION Left 05/2020   Social History:  reports that she has never smoked. She has never used smokeless tobacco. She reports that she does not currently use alcohol. She reports that she does not use drugs.  Allergies  Allergen Reactions   Citrus  Swelling and Rash   Egg Solids, Whole Dermatitis, Diarrhea, Hives, Itching, Nausea And Vomiting, Palpitations, Rash and Swelling   Penicillins Anaphylaxis and Rash   Shellfish Allergy Swelling   Codeine Nausea And Vomiting    Family History  Problem Relation Age of Onset   Anxiety disorder Mother    Diabetes Mother    Cancer Mother    Appendicitis Mother    Anxiety disorder Brother    Autism Brother    Anxiety disorder Brother    Bipolar disorder Brother    Anxiety disorder Son    Other Son        Hypogammaglobulinemia   Diabetes Maternal Grandmother    Heart attack Maternal Grandmother    Diabetes Maternal Grandfather    Heart attack Maternal Grandfather    Diabetes Paternal Grandmother    Heart attack Paternal Grandmother    Diabetes Paternal Grandfather    Heart attack Paternal Grandfather     Prior to Admission medications   Medication Sig Start Date End Date Taking? Authorizing Provider  Continuous Blood Gluc Sensor (FREESTYLE LIBRE 3 SENSOR) MISC 1 each by Miscellaneous route every fourteen (14) days. 07/17/21   [provider]  Insulin Regular Human (HUMULIN R U-500 KWIKPEN [place]) Inject 25-50 Units into the skin in the morning, at noon, and at bedtime.    [provider]  JARDIANCE 10 MG TABS tablet Take 10 mg by mouth daily. 01/03/21   [provider]  OZEMPIC, 0.25 OR 0.5 MG/DOSE, 2 MG/1.5ML SOPN Inject into the skin. 01/03/21   [provider]    Physical Exam: Vitals:   08/14/21 1951 08/14/21 1952 08/15/21 0000  BP: 126/76  124/80  Pulse: 94  95  Resp: 20  20  Temp: 99.4 F (37.4 C)  99.6 F (37.6 C)  TempSrc: Oral  Oral  SpO2: 96%  94%  Weight:  99.8 kg   Height:  5\' 5"  (1.651 m)    Physical Exam Vitals and nursing note reviewed.  Constitutional:      General: She is not in acute distress.    Appearance: She is ill-appearing.  HENT:     Head: Normocephalic and atraumatic.  Cardiovascular:     Rate and Rhythm:  Normal rate and regular rhythm.     Heart sounds: Normal heart sounds.  Pulmonary:     Effort: Pulmonary effort is normal. Tachypnea present.     Breath sounds: Normal breath sounds.  Abdominal:     Palpations: Abdomen is soft.     Tenderness: There is no abdominal tenderness.  Neurological:     Mental Status: Mental status is at baseline.     Labs on Admission: I have personally reviewed following labs and imaging studies  CBC: Recent Labs  Lab 08/14/21 1955  WBC 9.4  HGB 12.8  HCT 39.7  MCV 90.0  PLT 335   Basic Metabolic Panel: Recent Labs  Lab 08/14/21 1955  NA 142  K 3.5  CL 106  CO2 27  GLUCOSE 169*  BUN <5*  CREATININE 0.78  CALCIUM 8.7*   GFR: Estimated Creatinine Clearance: 114.8 mL/min (by C-G formula based on SCr of 0.78 mg/dL). Liver Function Tests: No results for input(s): "AST", "ALT", "ALKPHOS", "BILITOT", "PROT", "ALBUMIN" in the last 168 hours. No results for input(s): "LIPASE", "AMYLASE" in the last 168 hours. No results for input(s): "AMMONIA" in the last 168 hours. Coagulation Profile: No results for input(s): "INR", "PROTIME" in the last 168 hours. Cardiac Enzymes: No results for input(s): "CKTOTAL", "CKMB", "CKMBINDEX", "TROPONINI" in the last 168 hours. BNP (last 3 results) No results for input(s): "PROBNP" in the last 8760 hours. HbA1C: No results for input(s): "HGBA1C" in the last 72 hours. CBG: No results for input(s): "GLUCAP" in the last 168 hours. Lipid Profile: No results for input(s): "CHOL", "HDL", "LDLCALC", "TRIG", "CHOLHDL", "LDLDIRECT" in the last 72 hours. Thyroid Function Tests: No results for input(s): "TSH", "T4TOTAL", "FREET4", "T3FREE", "THYROIDAB" in the last 72 hours. Anemia Panel: No results for input(s): "VITAMINB12", "FOLATE", "FERRITIN", "TIBC", "IRON", "RETICCTPCT" in the last 72 hours. Urine analysis: No results found for: "COLORURINE", "APPEARANCEUR", "LABSPEC", "PHURINE", "GLUCOSEU", "HGBUR",  "BILIRUBINUR", "KETONESUR", "PROTEINUR", "UROBILINOGEN", "NITRITE", "LEUKOCYTESUR"  Radiological Exams on Admission: DG Chest 2 View  Result Date: 08/14/2021 CLINICAL DATA:  Chest pain EXAM: CHEST - 2 VIEW COMPARISON:  None Available. FINDINGS: Heart size and mediastinal contours are within normal limits. Central predominant bilateral airspace opacities, most likely in the right upper lobe, lingula and upper lobes. No pleural effusion or pneumothorax. IMPRESSION: Central predominant bilateral airspace opacities, concerning for infection. Electronically Signed   By: 10/14/2021 M.D.   On: 08/14/2021 20:30     Data Reviewed: Relevant notes from primary care and specialist visits, past discharge summaries as available in EHR, including Care Everywhere. Prior diagnostic testing as pertinent to current admission diagnoses Updated medications and problem lists for reconciliation ED course, including vitals, labs, imaging, treatment and response to treatment Triage  notes, nursing and pharmacy notes and ED provider's notes Notable results as noted in HPI   Assessment and Plan: * Bilateral pneumonia Hypoxia Normal WBC and procalcitonin less than 0.1 Follow respiratory viral panel Patient was started on Paxlovid from the ED as her son is currently ill and has COVID.  Patient's PCR was negative Will await respiratory viral panel Supplemental O2, antitussives and supportive care        DVT prophylaxis: Lovenox  Consults: none  Advance Care Planning: full code  Family Communication: none  Disposition Plan: Back to previous home environment  Severity of Illness: The appropriate patient status for this patient is OBSERVATION. Observation status is judged to be reasonable and necessary in order to provide the required intensity of service to ensure the patient's safety. The patient's presenting symptoms, physical exam findings, and initial radiographic and laboratory data in the  context of their medical condition is felt to place them at decreased risk for further clinical deterioration. Furthermore, it is anticipated that the patient will be medically stable for discharge from the hospital within 2 midnights of admission.   Author: Andris Baumann, MD 08/15/2021 1:54 AM  For on call review [email or web address].

## 2021-08-15 NOTE — ED Provider Notes (Signed)
[facility] Provider Note    Event Date/Time   First MD Initiated Contact with Patient 08/15/21 0131     (approximate)   History   Chest Pain and Fever   HPI  Debbie Travis is a 36 y.o. female with history of insulin-dependent type 2 diabetes who presents to the emergency department 2 days of fever, cough, chest burning, sore throat, shortness of breath.  States her son just tested positive for COVID-19.  States her brother also has similar symptoms but he was negative for COVID at home asthma she.  She denies any vomiting or diarrhea.  No urinary symptoms.  She does not smoke or wear oxygen at home.  No history of asthma.   History provided by patient.    Past Medical History:  Diagnosis Date   Diabetes mellitus without complication (HCC)    Hip impingement syndrome, right    Labral tear of right hip joint    Mitral valve prolapse     Past Surgical History:  Procedure Laterality Date   CHOLECYSTECTOMY  03/2003   HIP SURGERY Right 07/12/2020   femoral acetabullar impingement   LEG TENDON SURGERY Left 2004   TONSILLECTOMY Bilateral 09/2007   WISDOM TOOTH EXTRACTION Left 05/2020    MEDICATIONS:  Prior to Admission medications   Medication Sig Start Date End Date Taking? Authorizing Provider  Continuous Blood Gluc Sensor (FREESTYLE LIBRE 3 SENSOR) MISC 1 each by Miscellaneous route every fourteen (14) days. 07/17/21   [provider]  Insulin Regular Human (HUMULIN R U-500 KWIKPEN [place]) Inject 25-50 Units into the skin in the morning, at noon, and at bedtime.    [provider]  JARDIANCE 10 MG TABS tablet Take 10 mg by mouth daily. 01/03/21   [provider]  OZEMPIC, 0.25 OR 0.5 MG/DOSE, 2 MG/1.5ML SOPN Inject into the skin. 01/03/21   [provider]    Physical Exam   Triage Vital Signs: ED Triage Vitals  Enc Vitals Group     BP 08/14/21 1951 126/76     Pulse Rate 08/14/21 1951 94     Resp  08/14/21 1951 20     Temp 08/14/21 1951 99.4 F (37.4 C)     Temp Source 08/14/21 1951 Oral     SpO2 08/14/21 1951 96 %     Weight 08/14/21 1952 220 lb (99.8 kg)     Height 08/14/21 1952 5\' 5"  (1.651 m)     Head Circumference --      Peak Flow --      Pain Score 08/14/21 1952 10     Pain Loc --      Pain Edu? --      Excl. in GC? --     Most recent vital signs: Vitals:   08/14/21 1951 08/15/21 0000  BP: 126/76 124/80  Pulse: 94 95  Resp: 20 20  Temp: 99.4 F (37.4 C) 99.6 F (37.6 C)  SpO2: 96% 94%    CONSTITUTIONAL: Alert and oriented and responds appropriately to questions. Well-appearing; well-nourished HEAD: Normocephalic, atraumatic EYES: Conjunctivae clear, pupils appear equal, sclera nonicteric ENT: normal nose; moist mucous membranes, normal phonation, no trismus or drooling NECK: Supple, normal ROM CARD: RRR; S1 and S2 appreciated; no murmurs, no clicks, no rubs, no gallops RESP: Normal chest excursion without splinting or tachypnea; breath sounds clear and equal bilaterally; no wheezes, no rhonchi, no rales, patient is hypoxic to 89% on room air at rest ABD/GI: Normal bowel sounds;  non-distended; soft, non-tender, no rebound, no guarding, no peritoneal signs BACK: The back appears normal EXT: Normal ROM in all joints; no deformity noted, no edema; no cyanosis SKIN: Normal color for age and race; warm; no rash on exposed skin NEURO: Moves all extremities equally, normal speech PSYCH: The patient's mood and manner are appropriate.   ED Results / Procedures / Treatments   LABS: (all labs ordered are listed, but only abnormal results are displayed) Labs Reviewed  BASIC METABOLIC PANEL - Abnormal; Notable for the following components:      Result Value   Glucose, Bld 169 (*)    BUN <5 (*)    Calcium 8.7 (*)    All other components within normal limits  SARS CORONAVIRUS 2 BY RT PCR  GROUP A STREP BY PCR  CULTURE, BLOOD (ROUTINE X 2)  CULTURE, BLOOD (ROUTINE  X 2)  RESPIRATORY PANEL BY PCR  CBC  LACTIC ACID, PLASMA  PROCALCITONIN  LACTIC ACID, PLASMA  CREATININE, SERUM  HIV ANTIBODY (ROUTINE TESTING W REFLEX)  HEMOGLOBIN A1C  TROPONIN I (HIGH SENSITIVITY)  TROPONIN I (HIGH SENSITIVITY)     EKG:  EKG Interpretation  Date/Time:  Monday August 14 2021 19:48:11 EDT Ventricular Rate:  93 PR Interval:  148 QRS Duration: 72 QT Interval:  368 QTC Calculation: 457 R Axis:   -3 Text Interpretation: Normal sinus rhythm Normal ECG No previous ECGs available Confirmed by Rochele Raring [phone]) on 08/15/2021 1:42:35 AM         RADIOLOGY: My personal review and interpretation of imaging: Chest x-ray shows bilateral pneumonia in the bilateral upper lobes and lingula.  I have personally reviewed all radiology reports.   DG Chest 2 View  Result Date: 08/14/2021 CLINICAL DATA:  Chest pain EXAM: CHEST - 2 VIEW COMPARISON:  None Available. FINDINGS: Heart size and mediastinal contours are within normal limits. Central predominant bilateral airspace opacities, most likely in the right upper lobe, lingula and upper lobes. No pleural effusion or pneumothorax. IMPRESSION: Central predominant bilateral airspace opacities, concerning for infection. Electronically Signed   By: Allegra Lai M.D.   On: 08/14/2021 20:30     PROCEDURES:  Critical Care performed: Yes, see critical care procedure note(s)   CRITICAL CARE Performed by: Baxter Hire Simranjit Thayer   Total critical care time: 40 minutes  Critical care time was exclusive of separately billable procedures and treating other patients.  Critical care was necessary to treat or prevent imminent or life-threatening deterioration.  Critical care was time spent personally by me on the following activities: development of treatment plan with patient and/or surrogate as well as nursing, discussions with consultants, evaluation of patient's response to treatment, examination of patient, obtaining history from  patient or surrogate, ordering and performing treatments and interventions, ordering and review of laboratory studies, ordering and review of radiographic studies, pulse oximetry and re-evaluation of patient's condition.   Marland Kitchen1-3 Lead EKG Interpretation  Performed by: Velicia Dejager, Layla Maw, DO Authorized by: Arkie Tagliaferro, Layla Maw, DO     Interpretation: normal     ECG rate:  95   ECG rate assessment: normal     Rhythm: sinus rhythm     Ectopy: none     Conduction: normal       IMPRESSION / MDM / ASSESSMENT AND PLAN / ED COURSE  I reviewed the triage vital signs and the nursing notes.    Patient here with fever, cough, sore throat, congestion and shortness of breath.  Son just tested positive for COVID-19.  The patient is on the cardiac monitor to evaluate for evidence of arrhythmia and/or significant heart rate changes.   DIFFERENTIAL DIAGNOSIS (includes but not limited to):   COVID, other viral URI, bacterial pneumonia, pneumothorax, doubt ACS, PE or dissection   Patient's presentation is most consistent with acute presentation with potential threat to life or bodily function.   PLAN: Labs obtained from triage including CBC, BMP, troponin, procalcitonin, lactic, strep, COVID swabs.  No leukocytosis.  Normal electrolytes.  Lactic normal.  Procalcitonin negative.  Troponin negative.  COVID and strep negative.  Chest x-ray obtained as well and was reviewed and interpreted by myself and the radiologist and shows bilateral upper lobe pneumonia.  I am concerned that this is likely viral in nature given the negative procalcitonin and COVID exposure.  Discussed this with patient.  I will start her on Paxlovid.  During my evaluation, patient is awake and sitting upright in the bed talking to me her and her sats are 89% on room air.  She is doing well on 2 L nasal cannula.  She does not wear oxygen at home and has no history of respiratory illness.  Her lungs are currently clear to auscultation.  We will  also cover with doxycycline for possible bacterial causes of community-acquired pneumonia until proven otherwise that this is viral in nature.  She may need a repeat COVID swab in the next day or 2.  Will discuss with hospitalist for admission for pneumonia and hypoxia.   MEDICATIONS GIVEN IN ED: Medications  doxycycline (VIBRAMYCIN) 100 mg in sodium chloride 0.9 % 250 mL IVPB (has no administration in time range)  nirmatrelvir/ritonavir EUA (PAXLOVID) 3 tablet (has no administration in time range)  ketorolac (TORADOL) 30 MG/ML injection 30 mg (has no administration in time range)  0.9 %  sodium chloride infusion (has no administration in time range)  enoxaparin (LOVENOX) injection 50 mg (has no administration in time range)  acetaminophen (TYLENOL) tablet 650 mg (has no administration in time range)    Or  acetaminophen (TYLENOL) suppository 650 mg (has no administration in time range)  ondansetron (ZOFRAN) tablet 4 mg (has no administration in time range)    Or  ondansetron (ZOFRAN) injection 4 mg (has no administration in time range)  insulin aspart (novoLOG) injection 0-15 Units (has no administration in time range)  insulin aspart (novoLOG) injection 0-5 Units (has no administration in time range)  HYDROcodone-acetaminophen (NORCO/VICODIN) 5-325 MG per tablet 1-2 tablet (has no administration in time range)  morphine (PF) 2 MG/ML injection 2 mg (has no administration in time range)     ED COURSE: I have also ordered a respiratory viral panel on patient which is currently pending.  Cultures also pending.  She is not septic here.   CONSULTS:  Consulted and discussed patient's case with hospitalist, Dr. Para March.  I have recommended admission and consulting physician agrees and will place admission orders.  Patient (and family if present) agree with this plan.   I reviewed all nursing notes, vitals, pertinent previous records.  All labs, EKGs, imaging ordered have been independently  reviewed and interpreted by myself.    OUTSIDE RECORDS REVIEWED: Reviewed patient's last endocrinology note on 07/17/2021.       FINAL CLINICAL IMPRESSION(S) / ED DIAGNOSES   Final diagnoses:  Community acquired pneumonia, unspecified laterality  Acute hypoxemic respiratory failure (HCC)     Rx / DC Orders   ED Discharge Orders     None  Note:  This document was prepared using Dragon voice recognition software and may include unintentional dictation errors.   Karren Newland, Delice Bison, DO 08/15/21 [phone]

## 2021-08-15 NOTE — Progress Notes (Signed)
Pt admitted to the floor for bilateral PNA. RN started IVF and notified pt she was admitted for IV antibiotics. Pt was unhappy and stated she wanted to leave. After a while pt agree to stay for this afternoon antibiotic dose. 30 minutes later pt took IV out and walked to the nurses station and requested AMA papers. AMA papers were signed and belongings left the floor with pt.

## 2021-08-16 ENCOUNTER — Telehealth: Payer: Self-pay

## 2021-08-16 ENCOUNTER — Telehealth: Payer: Self-pay | Admitting: Family Medicine

## 2021-08-16 ENCOUNTER — Encounter: Payer: Self-pay | Admitting: Family Medicine

## 2021-08-16 ENCOUNTER — Ambulatory Visit (INDEPENDENT_AMBULATORY_CARE_PROVIDER_SITE_OTHER): Payer: BC Managed Care – PPO | Admitting: Family Medicine

## 2021-08-16 VITALS — BP 106/78 | HR 81 | Ht 65.0 in | Wt 228.0 lb

## 2021-08-16 DIAGNOSIS — J157 Pneumonia due to Mycoplasma pneumoniae: Secondary | ICD-10-CM | POA: Diagnosis not present

## 2021-08-16 HISTORY — DX: Pneumonia due to Mycoplasma pneumoniae: J15.7

## 2021-08-16 MED ORDER — AZITHROMYCIN 250 MG PO TABS: ORAL_TABLET | ORAL | 0 refills | Status: AC

## 2021-08-16 MED ORDER — PROMETHAZINE-DM 6.25-15 MG/5ML PO SYRP: 5.0000 mL | ORAL_SOLUTION | Freq: Four times a day (QID) | ORAL | 0 refills | Status: DC | PRN

## 2021-08-16 NOTE — Telephone Encounter (Signed)
Patient called back returning call for Debbie Travis. Patient would like a call back.

## 2021-08-16 NOTE — Progress Notes (Signed)
Primary Care / Sports Medicine Office Visit  Patient Information:  Patient ID: Debbie Travis, female DOB: March 22, 1985 Age: 36 y.o. MRN: [id number]   Debbie Travis is a pleasant 36 y.o. female presenting with the following:  Chief Complaint  Patient presents with   Pneumonia    Sore throat, cough, fever went to hospital but left before being treated. Had 1 round of antibiotics in hospital    Vitals:   08/16/21 1528  BP: 106/78  Pulse: 81  SpO2: 95%   Vitals:   08/16/21 1528  Weight: 228 lb (103.4 kg)  Height: 5\' 5"  (1.651 m)   Body mass index is 37.94 kg/m.     Independent interpretation of notes and tests performed by another provider:   None  Procedures performed:   None  Pertinent History, Exam, Impression, and Recommendations:   Problem List Items Addressed This Visit       Respiratory   Pneumonia of both lungs due to Mycoplasma pneumoniae - Primary    Patient presents for hospital admission follow-up, presented to the ER on 08/15/2021 with concern over cough, chest pain, fever, son with COVID, patient's own home COVID test was negative on 08/14/2021, had denies shortness of breath, nausea, vomiting, home Tmax 104 F.  When she presented to the ER she was found to have temperature 98.6, pulse 95, respirations 20, blood pressure 126/76 and had desaturation to 89% on room air, was placed on oxygen, repeat COVID and group A strep negative, cultures and RPP ordered.  Her chest x-ray was obtained and showed bilateral opacities raising concern for infection, given a treatment of doxycycline and started on Paxlovid.  She was given IV fluids, supportive care, and respiratory panel revealed mycoplasma pneumonia.  Unfortunately, prior to administration of antibiotics she left AMA citing stress related to hospital admission, in turn due to pas familial experiences. She presents now for follow-up.  She states that overall she is feeling better, her breathing has  improved, she is afebrile and vitals are significant for oxygen saturation 95%, benign cardiac sounds, there is coarse breath sounds and equal air entry throughout all lung fields.  Given her interval improvement, known etiology, plan for azithromycin, heavy focus on supportive care, and I have stressed the importance of very low threshold for return to ER.  She will remain out of work until next Monday and this can be extended based on her symptoms/medical progression.  Per ER chart review and given her relative immunocompromise state, I have encouraged her to retake a COVID test 3 days after the original, that would be tomorrow, and if positive she is to contact Wednesday for antiviral treatment as an adjunct to the azithromycin.      Relevant Medications   azithromycin (ZITHROMAX) 250 MG tablet   promethazine-dextromethorphan (PROMETHAZINE-DM) 6.25-15 MG/5ML syrup     Orders & Medications Meds ordered this encounter  Medications   azithromycin (ZITHROMAX) 250 MG tablet    Sig: Take 2 tablets on day 1, then 1 tablet daily on days 2 through 5    Dispense:  6 tablet    Refill:  0   promethazine-dextromethorphan (PROMETHAZINE-DM) 6.25-15 MG/5ML syrup    Sig: Take 5 mLs by mouth 4 (four) times daily as needed for cough.    Dispense:  118 mL    Refill:  0   No orders of the defined types were placed in this encounter.    Return if symptoms worsen or fail to improve.  Montel Culver, MD   Primary Care Sports Medicine [place]

## 2021-08-16 NOTE — Patient Instructions (Addendum)
-   Dose antibiotics for the full course - Use Rx cough medicine on as-needed basis - Start Mucinex twice daily for the duration of antibiotics then as needed - Retake home COVID test tomorrow and contact our office if positive - Drink plenty of fluids and get plenty of rest - If any worsening respiratory/breathing symptoms, go to ER - Remain out of work until next Monday (note provided), if this needs to be extended contact our office

## 2021-08-16 NOTE — Assessment & Plan Note (Signed)
Patient presents for hospital admission follow-up, presented to the ER on 08/15/2021 with concern over cough, chest pain, fever, son with COVID, patient's own home COVID test was negative on 08/14/2021, had denies shortness of breath, nausea, vomiting, home Tmax 104 F.  When she presented to the ER she was found to have temperature 98.6, pulse 95, respirations 20, blood pressure 126/76 and had desaturation to 89% on room air, was placed on oxygen, repeat COVID and group A strep negative, cultures and RPP ordered.  Her chest x-ray was obtained and showed bilateral opacities raising concern for infection, given a treatment of doxycycline and started on Paxlovid.  She was given IV fluids, supportive care, and respiratory panel revealed mycoplasma pneumonia.  Unfortunately, prior to administration of antibiotics she left AMA citing stress related to hospital admission, in turn due to pas familial experiences. She presents now for follow-up.  She states that overall she is feeling better, her breathing has improved, she is afebrile and vitals are significant for oxygen saturation 95%, benign cardiac sounds, there is coarse breath sounds and equal air entry throughout all lung fields.  Given her interval improvement, known etiology, plan for azithromycin, heavy focus on supportive care, and I have stressed the importance of very low threshold for return to ER.  She will remain out of work until next Monday and this can be extended based on her symptoms/medical progression.  Per ER chart review and given her relative immunocompromise state, I have encouraged her to retake a COVID test 3 days after the original, that would be tomorrow, and if positive she is to contact us for antiviral treatment as an adjunct to the azithromycin.

## 2021-08-16 NOTE — Telephone Encounter (Signed)
Error

## 2021-08-18 DIAGNOSIS — M25551 Pain in right hip: Secondary | ICD-10-CM | POA: Diagnosis not present

## 2021-08-18 DIAGNOSIS — M76891 Other specified enthesopathies of right lower limb, excluding foot: Secondary | ICD-10-CM | POA: Diagnosis not present

## 2021-08-20 LAB — CULTURE, BLOOD (ROUTINE X 2)
Culture: NO GROWTH
Special Requests: ADEQUATE

## 2021-08-30 ENCOUNTER — Ambulatory Visit
Admission: RE | Admit: 2021-08-30 | Discharge: 2021-08-30 | Disposition: A | Discharge status: Home / Self Care | Payer: BC Managed Care – PPO | Source: Ambulatory Visit | Attending: Family Medicine | Admitting: Family Medicine

## 2021-08-30 ENCOUNTER — Encounter: Payer: Self-pay | Admitting: Family Medicine

## 2021-08-30 ENCOUNTER — Other Ambulatory Visit: Payer: Self-pay

## 2021-08-30 ENCOUNTER — Ambulatory Visit
Admission: RE | Admit: 2021-08-30 | Discharge: 2021-08-30 | Disposition: A | Discharge status: Home / Self Care | Payer: BC Managed Care – PPO | Attending: Family Medicine | Admitting: Family Medicine

## 2021-08-30 DIAGNOSIS — D849 Immunodeficiency, unspecified: Secondary | ICD-10-CM

## 2021-08-30 DIAGNOSIS — J189 Pneumonia, unspecified organism: Secondary | ICD-10-CM | POA: Insufficient documentation

## 2021-08-30 DIAGNOSIS — R059 Cough, unspecified: Secondary | ICD-10-CM | POA: Diagnosis not present

## 2021-08-30 MED ORDER — BENZONATATE 100 MG PO CAPS: 100.0000 mg | ORAL_CAPSULE | Freq: Three times a day (TID) | ORAL | 0 refills | Status: DC | PRN

## 2021-08-30 NOTE — Telephone Encounter (Signed)
Please review.  KP

## 2021-09-01 ENCOUNTER — Institutional Professional Consult (permissible substitution): Payer: BC Managed Care – PPO | Admitting: Student in an Organized Health Care Education/Training Program

## 2021-09-12 ENCOUNTER — Ambulatory Visit (INDEPENDENT_AMBULATORY_CARE_PROVIDER_SITE_OTHER): Payer: BC Managed Care – PPO

## 2021-09-12 ENCOUNTER — Ambulatory Visit: Payer: BC Managed Care – PPO | Attending: Cardiovascular Disease | Admitting: Cardiovascular Disease

## 2021-09-12 ENCOUNTER — Encounter: Payer: Self-pay | Admitting: Cardiovascular Disease

## 2021-09-12 VITALS — BP 82/60 | HR 77 | Ht 65.0 in | Wt 227.5 lb

## 2021-09-12 DIAGNOSIS — R002 Palpitations: Secondary | ICD-10-CM

## 2021-09-12 DIAGNOSIS — M25851 Other specified joint disorders, right hip: Secondary | ICD-10-CM | POA: Diagnosis not present

## 2021-09-12 DIAGNOSIS — E119 Type 2 diabetes mellitus without complications: Secondary | ICD-10-CM

## 2021-09-12 DIAGNOSIS — M24151 Other articular cartilage disorders, right hip: Secondary | ICD-10-CM | POA: Diagnosis not present

## 2021-09-12 DIAGNOSIS — M25351 Other instability, right hip: Secondary | ICD-10-CM | POA: Diagnosis not present

## 2021-09-12 DIAGNOSIS — I479 Paroxysmal tachycardia, unspecified: Secondary | ICD-10-CM

## 2021-09-12 DIAGNOSIS — I341 Nonrheumatic mitral (valve) prolapse: Secondary | ICD-10-CM | POA: Diagnosis not present

## 2021-09-12 DIAGNOSIS — R0602 Shortness of breath: Secondary | ICD-10-CM

## 2021-09-12 MED ORDER — PROPRANOLOL HCL 10 MG PO TABS: 10.0000 mg | ORAL_TABLET | Freq: Three times a day (TID) | ORAL | 3 refills | Status: DC | PRN

## 2021-09-12 NOTE — Progress Notes (Signed)
Cardiology Office Note  Date:  09/12/2021   ID:  Debbie Travis, DOB [birth year], MRN [id number]  PCP:  Jerrol Banana, MD   Chief Complaint  Patient presents with   New Patient (Initial Visit)    Ref by Dr. Ashley Royalty for palpitations. Patient c/o right foot swelling at times, shortness of breath, palpitations and chest pain that comes and goes. Medications reviewed by the patient verbally.      HPI:  Ms. Debbie Travis is a 36 year old woman with medical history of Diabetes Palpitations Who presents by referral from Dr. Joseph Berkshire for consultation of her palpitations/tachycardia cardia, leg swelling, mitral valve prolapse   hospital admission, presented to the ER on 08/15/2021 with concern over cough, chest pain, fever, son with COVID, patient's own home COVID test was negative on 08/14/2021, had denies shortness of breath, nausea, vomiting, home Tmax 104 F.  When she presented to the ER , desaturation to 89% on room air, was placed on oxygen, repeat COVID and group A strep negative  Her chest x-ray showed bilateral opacities raising concern for infection,  given a treatment of doxycycline and started on Paxlovid.  She reports having long history of tachycardia with exertion for years, Stopped b-blocker, previously taking metoprolol succinate 50 Blood pressure typically runs low  Has had significant cardiac testing in the past Holter in 2021 in [place] Has had more than 1 echocardiogram, results not available  Hip surgery July 2022 Reinjured, followed at [facility] Reports having trace ankle swelling on the right  EKG personally reviewed by myself on todays visit Normal sinus rhythm rate 77 bpm nonspecific T wave abnormality   PMH:   has a past medical history of Diabetes mellitus without complication (HCC), Hip impingement syndrome, right, Labral tear of right hip joint, and Mitral valve prolapse.  PSH:    Past Surgical History:  Procedure Laterality Date    CHOLECYSTECTOMY  03/2003   HIP SURGERY Right 07/12/2020   femoral acetabullar impingement   LEG TENDON SURGERY Left 2004   TONSILLECTOMY Bilateral 09/2007   WISDOM TOOTH EXTRACTION Left 05/2020    Current Outpatient Medications  Medication Sig Dispense Refill   benzonatate (TESSALON PERLES) 100 MG capsule Take 1 capsule (100 mg total) by mouth 3 (three) times daily as needed for cough. 20 capsule 0   Continuous Blood Gluc Sensor (FREESTYLE LIBRE 3 SENSOR) MISC 1 each by Miscellaneous route every fourteen (14) days.     Insulin Regular Human (HUMULIN R U-500 KWIKPEN [place]) Inject 25-50 Units into the skin in the morning, at noon, and at bedtime. 30 units in the am and sliding scale at lunch and dinner     JARDIANCE 25 MG TABS tablet Take 25 mg by mouth daily.     melatonin 5 MG TABS Take 5 mg by mouth at bedtime as needed.     OZEMPIC, 1 MG/DOSE, 4 MG/3ML SOPN Inject 4 mg into the skin once a week.     promethazine-dextromethorphan (PROMETHAZINE-DM) 6.25-15 MG/5ML syrup Take 5 mLs by mouth 4 (four) times daily as needed for cough. 118 mL 0   No current facility-administered medications for this visit.     Allergies:   Citrus; Egg solids, whole; Penicillins; Shellfish allergy; Other; and Codeine   Social History:  The patient  reports that she has never smoked. She has never used smokeless tobacco. She reports that she does not currently use alcohol. She reports that she does not use drugs.   Family History:   family  history includes Anxiety disorder in her brother, brother, mother, and son; Appendicitis in her mother; Autism in her brother; Bipolar disorder in her brother; Cancer in her mother; Diabetes in her maternal grandfather, maternal grandmother, mother, paternal grandfather, and paternal grandmother; Heart attack in her maternal grandfather, maternal grandmother, paternal grandfather, and paternal grandmother; Other in her son.    Review of Systems: Review of Systems   Constitutional: Negative.   HENT: Negative.    Respiratory: Negative.    Cardiovascular:  Positive for palpitations and leg swelling.  Gastrointestinal: Negative.   Musculoskeletal: Negative.   Neurological: Negative.   Psychiatric/Behavioral: Negative.    All other systems reviewed and are negative.   PHYSICAL EXAM: VS:  BP (!) 82/60 (BP Location: Right Arm, Patient Position: Sitting, Cuff Size: Large)   Pulse 77   Ht 5\' 5"  (1.651 m)   Wt 227 lb 8 oz (103.2 kg)   LMP  (LMP Unknown)   SpO2 98%   BMI 37.86 kg/m  , BMI Body mass index is 37.86 kg/m. GEN: Well nourished, well developed, in no acute distress HEENT: normal Neck: no JVD, carotid bruits, or masses Cardiac: RRR; no murmurs, rubs, or gallops,no edema  Respiratory:  clear to auscultation bilaterally, normal work of breathing GI: soft, nontender, nondistended, + BS MS: no deformity or atrophy Skin: warm and dry, no rash Neuro:  Strength and sensation are intact Psych: euthymic mood, full affect   Recent Labs: 11/03/2020: TSH 2.630 08/14/2021: BUN <5; Creatinine, Ser 0.78; Hemoglobin 12.8; Platelets 335; Potassium 3.5; Sodium 142    Lipid Panel No results found for: "CHOL", "HDL", "LDLCALC", "TRIG"    Wt Readings from Last 3 Encounters:  09/12/21 227 lb 8 oz (103.2 kg)  08/16/21 228 lb (103.4 kg)  08/14/21 220 lb (99.8 kg)       ASSESSMENT AND PLAN:  Problem List Items Addressed This Visit     Type 2 diabetes mellitus without complication (HCC)   Palpitations - Primary   Other Visit Diagnoses     Paroxysmal tachycardia (HCC)       MVP (mitral valve prolapse)          Paroxysmal tachycardia Reports having tachycardia, worse with exertion Leading to tightness in her chest especially on exertion Previous on metoprolol succinate 50 daily but has not been taking this No outside data available, we have ordered a Zio monitor for 2 weeks Recommend she take propranolol 10 mg 3 times daily as needed for  symptomatic tachycardia.  Low blood pressure limiting beta-blocker dosing  Mitral valve prolapse No data available, echocardiogram has been ordered for our system Records have also been requested  Leg swelling Minimal on exam Likely dependent edema, recommended leg elevation, compression hose as needed Echocardiogram pending above    Total encounter time more than 60 minutes  Greater than 50% was spent in counseling and coordination of care with the patient  Patient seen in consultation for 10/14/21 will be referred back to his office for ongoing care of the issues detailed above  Signed, Kerri Perches, M.D., Ph.D. [facility] [place], [place]

## 2021-09-12 NOTE — Patient Instructions (Addendum)
Medication Instructions:   START Propranolol 10 mg up to three times a day as needed for fast heart rate  If you need a refill on your cardiac medications before your next appointment, please call your pharmacy.   Lab work: No new labs needed  Testing/Procedures:  1) Your physician has requested that you have an echocardiogram (AFTER wearing 2 week Zio monitor). Echocardiography is a painless test that uses sound waves to create images of your heart. It provides your doctor with information about the size and shape of your heart and how well your heart's chambers and valves are working. This procedure takes approximately one hour. There are no restrictions for this procedure.  2) Your physician has recommended that you wear a Zio monitor for TWO WEEKS. This will be mailed.    This monitor is a medical device that records the heart's electrical activity. Doctors most often use these monitors to diagnose arrhythmias. Arrhythmias are problems with the speed or rhythm of the heartbeat. The monitor is a small device applied to your chest. You can wear one while you do your normal daily activities. While wearing this monitor if you have any symptoms to push the button and record what you felt. Once you have worn this monitor for the period of time provider prescribed (Usually 14 days), you will return the monitor device in the postage paid box. Once it is returned they will download the data collected and provide [place] with a report which the provider will then review and we will call you with those results. Important tips:  Avoid showering during the first 24 hours of wearing the monitor. Avoid excessive sweating to help maximize wear time. Do not submerge the device, no hot tubs, and no swimming pools. Keep any lotions or oils away from the patch. After 24 hours you may shower with the patch on. Take brief showers with your back facing the shower head.  Do not remove patch once it has been placed  because that will interrupt data and decrease adhesive wear time. Push the button when you have any symptoms and write down what you were feeling. Once you have completed wearing your monitor, remove and place into box which has postage paid and place in your outgoing mailbox.  If for some reason you have misplaced your box then call our office and we can provide another box and/or mail it off for you.  Follow-Up: At [facility], you and your health needs are our priority.  As part of our continuing mission to provide you with exceptional heart care, we have created designated Provider Care Teams.  These Care Teams include your primary Cardiologist (physician) and Advanced Practice Providers (APPs -  Physician Assistants and Nurse Practitioners) who all work together to provide you with the care you need, when you need it.  You will need a follow up appointment as needed We will call with results of testing  Providers on your designated Care Team:   Nicolasa Ducking, NP Eula Listen, PA-C Cadence Fransico Michael, PA-C  COVID-19 Vaccine Information can be found at: [email or web address] For questions related to vaccine distribution or appointments, please email [email or web address] or call [phone].

## 2021-09-19 ENCOUNTER — Institutional Professional Consult (permissible substitution): Payer: BC Managed Care – PPO | Admitting: Student in an Organized Health Care Education/Training Program

## 2021-09-28 DIAGNOSIS — Z794 Long term (current) use of insulin: Secondary | ICD-10-CM | POA: Diagnosis not present

## 2021-09-28 DIAGNOSIS — E119 Type 2 diabetes mellitus without complications: Secondary | ICD-10-CM | POA: Diagnosis not present

## 2021-09-28 DIAGNOSIS — N912 Amenorrhea, unspecified: Secondary | ICD-10-CM | POA: Diagnosis not present

## 2021-09-28 DIAGNOSIS — Z978 Presence of other specified devices: Secondary | ICD-10-CM | POA: Diagnosis not present

## 2021-09-28 DIAGNOSIS — E669 Obesity, unspecified: Secondary | ICD-10-CM | POA: Diagnosis not present

## 2021-10-10 DIAGNOSIS — I341 Nonrheumatic mitral (valve) prolapse: Secondary | ICD-10-CM | POA: Diagnosis not present

## 2021-10-10 DIAGNOSIS — M25851 Other specified joint disorders, right hip: Secondary | ICD-10-CM | POA: Diagnosis not present

## 2021-10-10 DIAGNOSIS — Z01818 Encounter for other preprocedural examination: Secondary | ICD-10-CM | POA: Diagnosis not present

## 2021-10-10 DIAGNOSIS — M25351 Other instability, right hip: Secondary | ICD-10-CM | POA: Diagnosis not present

## 2021-10-10 DIAGNOSIS — E119 Type 2 diabetes mellitus without complications: Secondary | ICD-10-CM | POA: Diagnosis not present

## 2021-10-10 DIAGNOSIS — R002 Palpitations: Secondary | ICD-10-CM | POA: Diagnosis not present

## 2021-10-10 DIAGNOSIS — M24151 Other articular cartilage disorders, right hip: Secondary | ICD-10-CM | POA: Diagnosis not present

## 2021-10-10 LAB — HEMOGLOBIN A1C: Hemoglobin A1C: 7.5

## 2021-10-13 ENCOUNTER — Ambulatory Visit (INDEPENDENT_AMBULATORY_CARE_PROVIDER_SITE_OTHER): Payer: BC Managed Care – PPO | Admitting: Family Medicine

## 2021-10-13 ENCOUNTER — Encounter: Payer: Self-pay | Admitting: Family Medicine

## 2021-10-13 VITALS — BP 118/68 | HR 78 | Temp 98.7°F | Ht 65.0 in | Wt 228.0 lb

## 2021-10-13 DIAGNOSIS — J0110 Acute frontal sinusitis, unspecified: Secondary | ICD-10-CM | POA: Diagnosis not present

## 2021-10-13 MED ORDER — AZELASTINE HCL 0.1 % NA SOLN: 2.0000 | Freq: Two times a day (BID) | NASAL | 1 refills | Status: DC

## 2021-10-13 MED ORDER — AZITHROMYCIN 250 MG PO TABS: ORAL_TABLET | ORAL | 0 refills | Status: AC

## 2021-10-13 NOTE — Assessment & Plan Note (Signed)
Few day history of facial pressure, congestion, ear pressure, dry cough, drainage.  She denies any shortness of years, no fevers, chills, no myalgias.  Examination findings reveal frontal, secondarily ethmoid and maxillary tenderness to percussion, erythematous turbinates bilaterally, benign oropharynx, tympanic membranes, right ear canal with faint erythema, contralateral benign, clear air entry throughout all lung fields without wheezes, rales, rhonchi, intermittent cough noted, benign cardiac sounds.  Patient's clinical history and features are consistent with sinusitis, given her comorbid medical risk factors, recent pneumonia, low threshold for treatment, will write for azithromycin course, Astelin nasal spray, follow-up as needed.

## 2021-10-13 NOTE — Patient Instructions (Signed)
-   Take azithromycin for full course - Continue Mucinex twice daily - Use Astelin nasal spray, 2 sprays each nostril twice daily while on antibiotics - Contact us for any persistent symptoms after antibiotic course - Can also contact us if needing medical clearance for upcoming surgery (obtain form from surgical group for completion and sent to [place])

## 2021-10-13 NOTE — Progress Notes (Signed)
     Primary Care / Sports Medicine Office Visit  Patient Information:  Patient ID: Debbie Travis, female DOB: [birth year] Age: 36 y.o. MRN: [id number]   Debbie Travis is a pleasant 36 y.o. female presenting with the following:  Chief Complaint  Patient presents with   Sinusitis    Ear stopped up, Took Covid test at home that is negative 3 times. Has lots of pressure in face and head    Vitals:   10/13/21 1401  BP: 118/68  Pulse: 78  Temp: 98.7 F (37.1 C)  SpO2: 99%   Vitals:   10/13/21 1401  Weight: 228 lb (103.4 kg)  Height: 5\' 5"  (1.651 m)   Body mass index is 37.94 kg/m.     Independent interpretation of notes and tests performed by another provider:   None  Procedures performed:   None  Pertinent History, Exam, Impression, and Recommendations:   Problem List Items Addressed This Visit       Respiratory   Acute frontal sinusitis - Primary    Few day history of facial pressure, congestion, ear pressure, dry cough, drainage.  She denies any shortness of years, no fevers, chills, no myalgias.  Examination findings reveal frontal, secondarily ethmoid and maxillary tenderness to percussion, erythematous turbinates bilaterally, benign oropharynx, tympanic membranes, right ear canal with faint erythema, contralateral benign, clear air entry throughout all lung fields without wheezes, rales, rhonchi, intermittent cough noted, benign cardiac sounds.  Patient's clinical history and features are consistent with sinusitis, given her comorbid medical risk factors, recent pneumonia, low threshold for treatment, will write for azithromycin course, Astelin nasal spray, follow-up as needed.      Relevant Medications   azithromycin (ZITHROMAX) 250 MG tablet   azelastine (ASTELIN) 0.1 % nasal spray     Orders & Medications Meds ordered this encounter  Medications   azithromycin (ZITHROMAX) 250 MG tablet    Sig: Take 2 tablets on day 1, then 1 tablet daily  on days 2 through 5    Dispense:  6 tablet    Refill:  0   azelastine (ASTELIN) 0.1 % nasal spray    Sig: Place 2 sprays into both nostrils 2 (two) times daily. Use in each nostril as directed    Dispense:  301 mL    Refill:  1    Use generic Astelin   No orders of the defined types were placed in this encounter.    No follow-ups on file.     Montel Culver, MD   Primary Care Sports Medicine [place]

## 2021-10-17 ENCOUNTER — Ambulatory Visit: Payer: BC Managed Care – PPO

## 2021-10-25 DIAGNOSIS — M24151 Other articular cartilage disorders, right hip: Secondary | ICD-10-CM | POA: Diagnosis not present

## 2021-10-25 DIAGNOSIS — M25851 Other specified joint disorders, right hip: Secondary | ICD-10-CM | POA: Diagnosis not present

## 2021-10-25 DIAGNOSIS — E119 Type 2 diabetes mellitus without complications: Secondary | ICD-10-CM | POA: Diagnosis not present

## 2021-10-25 DIAGNOSIS — Z79899 Other long term (current) drug therapy: Secondary | ICD-10-CM | POA: Diagnosis not present

## 2021-10-25 DIAGNOSIS — G8918 Other acute postprocedural pain: Secondary | ICD-10-CM | POA: Diagnosis not present

## 2021-10-25 DIAGNOSIS — M25351 Other instability, right hip: Secondary | ICD-10-CM | POA: Diagnosis not present

## 2021-10-25 DIAGNOSIS — Z6837 Body mass index (BMI) 37.0-37.9, adult: Secondary | ICD-10-CM | POA: Diagnosis not present

## 2021-10-25 DIAGNOSIS — E669 Obesity, unspecified: Secondary | ICD-10-CM | POA: Diagnosis not present

## 2021-10-31 DIAGNOSIS — M25551 Pain in right hip: Secondary | ICD-10-CM | POA: Diagnosis not present

## 2021-10-31 DIAGNOSIS — M25851 Other specified joint disorders, right hip: Secondary | ICD-10-CM | POA: Diagnosis not present

## 2021-10-31 DIAGNOSIS — M25651 Stiffness of right hip, not elsewhere classified: Secondary | ICD-10-CM | POA: Diagnosis not present

## 2021-10-31 DIAGNOSIS — R262 Difficulty in walking, not elsewhere classified: Secondary | ICD-10-CM | POA: Diagnosis not present

## 2021-11-02 DIAGNOSIS — M25551 Pain in right hip: Secondary | ICD-10-CM | POA: Diagnosis not present

## 2021-11-02 DIAGNOSIS — M25851 Other specified joint disorders, right hip: Secondary | ICD-10-CM | POA: Diagnosis not present

## 2021-11-02 DIAGNOSIS — R262 Difficulty in walking, not elsewhere classified: Secondary | ICD-10-CM | POA: Diagnosis not present

## 2021-11-02 DIAGNOSIS — M25651 Stiffness of right hip, not elsewhere classified: Secondary | ICD-10-CM | POA: Diagnosis not present

## 2021-11-06 DIAGNOSIS — M25551 Pain in right hip: Secondary | ICD-10-CM | POA: Diagnosis not present

## 2021-11-06 DIAGNOSIS — M25651 Stiffness of right hip, not elsewhere classified: Secondary | ICD-10-CM | POA: Diagnosis not present

## 2021-11-06 DIAGNOSIS — R262 Difficulty in walking, not elsewhere classified: Secondary | ICD-10-CM | POA: Diagnosis not present

## 2021-11-06 DIAGNOSIS — M25851 Other specified joint disorders, right hip: Secondary | ICD-10-CM | POA: Diagnosis not present

## 2021-11-09 DIAGNOSIS — R262 Difficulty in walking, not elsewhere classified: Secondary | ICD-10-CM | POA: Diagnosis not present

## 2021-11-09 DIAGNOSIS — M25851 Other specified joint disorders, right hip: Secondary | ICD-10-CM | POA: Diagnosis not present

## 2021-11-09 DIAGNOSIS — M25551 Pain in right hip: Secondary | ICD-10-CM | POA: Diagnosis not present

## 2021-11-09 DIAGNOSIS — M25651 Stiffness of right hip, not elsewhere classified: Secondary | ICD-10-CM | POA: Diagnosis not present

## 2021-11-13 DIAGNOSIS — R262 Difficulty in walking, not elsewhere classified: Secondary | ICD-10-CM | POA: Diagnosis not present

## 2021-11-13 DIAGNOSIS — M25651 Stiffness of right hip, not elsewhere classified: Secondary | ICD-10-CM | POA: Diagnosis not present

## 2021-11-13 DIAGNOSIS — M25851 Other specified joint disorders, right hip: Secondary | ICD-10-CM | POA: Diagnosis not present

## 2021-11-13 DIAGNOSIS — E119 Type 2 diabetes mellitus without complications: Secondary | ICD-10-CM | POA: Diagnosis not present

## 2021-11-13 DIAGNOSIS — M25551 Pain in right hip: Secondary | ICD-10-CM | POA: Diagnosis not present

## 2021-11-13 DIAGNOSIS — Z794 Long term (current) use of insulin: Secondary | ICD-10-CM | POA: Diagnosis not present

## 2021-11-14 DIAGNOSIS — Z9889 Other specified postprocedural states: Secondary | ICD-10-CM | POA: Insufficient documentation

## 2021-11-16 DIAGNOSIS — R262 Difficulty in walking, not elsewhere classified: Secondary | ICD-10-CM | POA: Diagnosis not present

## 2021-11-16 DIAGNOSIS — M25851 Other specified joint disorders, right hip: Secondary | ICD-10-CM | POA: Diagnosis not present

## 2021-11-16 DIAGNOSIS — M25651 Stiffness of right hip, not elsewhere classified: Secondary | ICD-10-CM | POA: Diagnosis not present

## 2021-11-16 DIAGNOSIS — M25551 Pain in right hip: Secondary | ICD-10-CM | POA: Diagnosis not present

## 2021-11-23 DIAGNOSIS — M25651 Stiffness of right hip, not elsewhere classified: Secondary | ICD-10-CM | POA: Diagnosis not present

## 2021-11-23 DIAGNOSIS — M25551 Pain in right hip: Secondary | ICD-10-CM | POA: Diagnosis not present

## 2021-11-23 DIAGNOSIS — R262 Difficulty in walking, not elsewhere classified: Secondary | ICD-10-CM | POA: Diagnosis not present

## 2021-11-23 DIAGNOSIS — M25851 Other specified joint disorders, right hip: Secondary | ICD-10-CM | POA: Diagnosis not present

## 2021-11-27 DIAGNOSIS — M25551 Pain in right hip: Secondary | ICD-10-CM | POA: Diagnosis not present

## 2021-11-27 DIAGNOSIS — R262 Difficulty in walking, not elsewhere classified: Secondary | ICD-10-CM | POA: Diagnosis not present

## 2021-11-27 DIAGNOSIS — M25851 Other specified joint disorders, right hip: Secondary | ICD-10-CM | POA: Diagnosis not present

## 2021-11-27 DIAGNOSIS — M25651 Stiffness of right hip, not elsewhere classified: Secondary | ICD-10-CM | POA: Diagnosis not present

## 2021-11-29 DIAGNOSIS — M25551 Pain in right hip: Secondary | ICD-10-CM | POA: Diagnosis not present

## 2021-11-29 DIAGNOSIS — R262 Difficulty in walking, not elsewhere classified: Secondary | ICD-10-CM | POA: Diagnosis not present

## 2021-11-29 DIAGNOSIS — M25651 Stiffness of right hip, not elsewhere classified: Secondary | ICD-10-CM | POA: Diagnosis not present

## 2021-11-29 DIAGNOSIS — M25851 Other specified joint disorders, right hip: Secondary | ICD-10-CM | POA: Diagnosis not present

## 2021-12-22 ENCOUNTER — Ambulatory Visit: Payer: BC Managed Care – PPO | Attending: Cardiovascular Disease

## 2022-01-09 ENCOUNTER — Ambulatory Visit (INDEPENDENT_AMBULATORY_CARE_PROVIDER_SITE_OTHER): Payer: Managed Care, Other (non HMO) | Admitting: Family Medicine

## 2022-01-09 ENCOUNTER — Encounter: Payer: Self-pay | Admitting: Family Medicine

## 2022-01-09 VITALS — BP 118/80 | HR 78 | Wt 221.0 lb

## 2022-01-09 DIAGNOSIS — E11628 Type 2 diabetes mellitus with other skin complications: Secondary | ICD-10-CM

## 2022-01-09 DIAGNOSIS — Z794 Long term (current) use of insulin: Secondary | ICD-10-CM | POA: Diagnosis not present

## 2022-01-09 DIAGNOSIS — S73191S Other sprain of right hip, sequela: Secondary | ICD-10-CM

## 2022-01-09 DIAGNOSIS — L02612 Cutaneous abscess of left foot: Secondary | ICD-10-CM | POA: Diagnosis not present

## 2022-01-09 MED ORDER — HUMULIN R U-500 KWIKPEN 500 UNIT/ML ~~LOC~~ SOPN: 25.0000 [IU] | PEN_INJECTOR | Freq: Three times a day (TID) | SUBCUTANEOUS | 3 refills | Status: DC

## 2022-01-09 MED ORDER — SULFAMETHOXAZOLE-TRIMETHOPRIM 800-160 MG PO TABS: 1.0000 | ORAL_TABLET | Freq: Two times a day (BID) | ORAL | 0 refills | Status: DC

## 2022-01-09 MED ORDER — FLUCONAZOLE 150 MG PO TABS: 150.0000 mg | ORAL_TABLET | Freq: Once | ORAL | 1 refills | Status: AC

## 2022-01-09 NOTE — Assessment & Plan Note (Addendum)
Onset of left fifth toe swelling from 12/30/2021, cracking, burning, purulent discharge yesterday.  Examination with frank erythema, discrete borders, swelling, warmth, no purulent discharge could be expressed, mild fluctuance, dried scab left without manipulation.  Sensorimotor otherwise intact.  Given diabetes mellitus type 2 status, purulent component, penicillin allergy, Bactrim DS course prescribed, referral to podiatry placed for follow-up on this issue as well as establishment of care for routine diabetic foot exams.  Adjunct Diflucan written for any yeast symptoms if they were to emerge in addition to supportive care for pruritus.  She is to contact us at the end of the antibiotics to determine the need for prolonging therapy versus modifying to alternate antibiotic agent.

## 2022-01-09 NOTE — Assessment & Plan Note (Signed)
Patient is status post surgery, pain has resolved.

## 2022-01-09 NOTE — Patient Instructions (Signed)
-   Take antibiotics for full 7-day course - Can take Diflucan if yeast noted - Keep toe clean, dry, and moisturized using hypoallergenic lotion - Referral coordinator will contact you to schedule visits with [place] endocrinology and podiatry - Contact our office for any persistent toe symptoms at the end of the antibiotics to discuss next steps - Return for annual physical 08/2022

## 2022-01-09 NOTE — Assessment & Plan Note (Signed)
Had established with [facility] endocrinology, states desire to be referred to [facility] endocrinology.  New referral placed, interim refill on insulin.

## 2022-01-09 NOTE — Progress Notes (Signed)
Primary Care / Sports Medicine Office Visit  Patient Information:  Patient ID: Debbie Travis, female DOB: May 03, 1985 Age: 37 y.o. MRN: [id number]   Debbie Travis is a pleasant 37 y.o. female presenting with the following:  Chief Complaint  Patient presents with   Nail Problem    Cracking of skin started 12/23 and she noticed puss and burning yesterday. Has tried fungus and Aquaphor with no help     Vitals:   01/09/22 1413  BP: 118/80  Pulse: 78  SpO2: 98%   Vitals:   01/09/22 1413  Weight: 221 lb (100.2 kg)   Body mass index is 36.78 kg/m.  No results found.   Independent interpretation of notes and tests performed by another provider:   None  Procedures performed:   None  Pertinent History, Exam, Impression, and Recommendations:   Debbie Travis was seen today for nail problem.  Abscess of fifth toe, left Assessment & Plan: Onset of left fifth toe swelling from 12/30/2021, cracking, burning, purulent discharge yesterday.  Examination with frank erythema, discrete borders, swelling, warmth, no purulent discharge could be expressed, mild fluctuance, dried scab left without manipulation.  Sensorimotor otherwise intact.  Given diabetes mellitus type 2 status, purulent component, penicillin allergy, Bactrim DS course prescribed, referral to podiatry placed for follow-up on this issue as well as establishment of care for routine diabetic foot exams.  Adjunct Diflucan written for any yeast symptoms if they were to emerge in addition to supportive care for pruritus.  She is to contact us at the end of the antibiotics to determine the need for prolonging therapy versus modifying to alternate antibiotic agent.  Orders: -     Sulfamethoxazole-Trimethoprim; Take 1 tablet by mouth 2 (two) times daily.  Dispense: 14 tablet; Refill: 0 -     Ambulatory referral to Podiatry  Type 2 diabetes mellitus with other skin complication, with long-term current use of insulin  (HCC) Overview:  Last Assessment & Plan:  Formatting of this note is different from the original. Patient has uncontrolled type 2 diabetes. A1c is 7.5%. Non fasting blood sugar in clinic is 181.   Assessment & Plan: Had established with [facility] endocrinology, states desire to be referred to [facility] endocrinology.  New referral placed, interim refill on insulin.  Orders: -     Ambulatory referral to Endocrinology -     HumuLIN R U-500 KwikPen; Inject 25-50 Units into the skin in the morning, at noon, and at bedtime. 30 units in the am and sliding scale at lunch and dinner  Dispense: 12 mL; Refill: 3  Tear of right acetabular labrum, sequela Overview: 10/25/2021 -revision arthroscopic hip labral repair  Assessment & Plan: Patient is status post surgery, pain has resolved.   Other orders -     Fluconazole; Take 1 tablet (150 mg total) by mouth once for 1 dose.  Dispense: 1 tablet; Refill: 1     Orders & Medications Meds ordered this encounter  Medications   insulin regular human CONCENTRATED (HUMULIN R U-500 KWIKPEN) 500 UNIT/ML KwikPen    Sig: Inject 25-50 Units into the skin in the morning, at noon, and at bedtime. 30 units in the am and sliding scale at lunch and dinner    Dispense:  12 mL    Refill:  3   sulfamethoxazole-trimethoprim (BACTRIM DS) 800-160 MG tablet    Sig: Take 1 tablet by mouth 2 (two) times daily.    Dispense:  14 tablet    Refill:  0   fluconazole (DIFLUCAN) 150 MG tablet    Sig: Take 1 tablet (150 mg total) by mouth once for 1 dose.    Dispense:  1 tablet    Refill:  1   Orders Placed This Encounter  Procedures   Ambulatory referral to Endocrinology   Ambulatory referral to Podiatry     Return in about 7 months (around 08/17/2022) for CPE.     Montel Culver, MD, [facility]   Primary Care Sports Medicine Primary Care and Sports Medicine at [facility]

## 2022-01-15 ENCOUNTER — Ambulatory Visit: Payer: Managed Care, Other (non HMO) | Admitting: Podiatry

## 2022-01-15 ENCOUNTER — Encounter: Payer: Self-pay | Admitting: Podiatry

## 2022-01-15 DIAGNOSIS — I7300 Raynaud's syndrome without gangrene: Secondary | ICD-10-CM

## 2022-01-15 DIAGNOSIS — L97521 Non-pressure chronic ulcer of other part of left foot limited to breakdown of skin: Secondary | ICD-10-CM

## 2022-01-15 MED ORDER — MUPIROCIN 2 % EX OINT: 1.0000 | TOPICAL_OINTMENT | Freq: Every day | CUTANEOUS | 2 refills | Status: AC

## 2022-01-16 NOTE — Progress Notes (Signed)
  Subjective:  Patient ID: Debbie Travis, female    DOB: Nov 20, 1985,  MRN: [id number]  Chief Complaint  Patient presents with   Diabetic Ulcer    np Abscess of fifth toe, left    37 y.o. female presents with the above complaint. History confirmed with patient.  She states that has been here for couple weeks now, thinks her crocs caused it.  Has not had much drainage but the toe has been red  Objective:  Physical Exam:   normal DP and PT pulses, and abnormal sensory exam some loss of protective sensation, she has an ulceration on the plantar lateral fifth digit tip, toes are cool to touch with reactive erythema, palpable DP and PT pulses.  No signs of drainage of the infection.  Measures 3 mm x 3 mm x 1 mm, has surrounding hyperkeratosis       Assessment:   1. Skin ulcer of small toe, left, limited to breakdown of skin ([place])   2. Raynaud's disease without gangrene      Plan:  Patient was evaluated and treated and all questions answered.  Ulceration overall seems to be limited to breakdown of skin.  I debrided the surrounding hyperkeratosis to the level of subcutaneous tissue in an excisional manner with a sharp scalpel.  Wound measurements are noted above.  There are no signs of infection.  Rx mupirocin ointment sent to pharmacy and I advised her on how to address this.  Silicone offloading pad also dispensed.  I discussed with her she likely has some component of Raynaud's disease contributing to wound formation.  Her pulses are palpable, unless she shows poor wound healing we will hold off on referral to vascular surgery at this point  Return in about 1 month (around 02/15/2022) for follow up ulcer on left foot .

## 2022-01-22 ENCOUNTER — Other Ambulatory Visit: Payer: Self-pay

## 2022-01-22 ENCOUNTER — Encounter: Payer: Self-pay | Admitting: Family Medicine

## 2022-01-22 DIAGNOSIS — M79661 Pain in right lower leg: Secondary | ICD-10-CM

## 2022-01-22 DIAGNOSIS — M25551 Pain in right hip: Secondary | ICD-10-CM

## 2022-01-22 NOTE — Telephone Encounter (Signed)
Place referral

## 2022-02-12 ENCOUNTER — Ambulatory Visit: Payer: Managed Care, Other (non HMO) | Admitting: Podiatry

## 2022-02-14 ENCOUNTER — Ambulatory Visit: Payer: Managed Care, Other (non HMO) | Attending: Cardiovascular Disease

## 2022-02-16 ENCOUNTER — Telehealth: Payer: Managed Care, Other (non HMO) | Admitting: Family Medicine

## 2022-02-16 ENCOUNTER — Encounter: Payer: Self-pay | Admitting: Family Medicine

## 2022-02-16 VITALS — Ht 65.0 in

## 2022-02-16 DIAGNOSIS — U071 COVID-19: Secondary | ICD-10-CM

## 2022-02-16 MED ORDER — NIRMATRELVIR/RITONAVIR (PAXLOVID)TABLET: 3.0000 | ORAL_TABLET | Freq: Two times a day (BID) | ORAL | 0 refills | Status: AC

## 2022-02-16 MED ORDER — PROMETHAZINE-DM 6.25-15 MG/5ML PO SYRP: 5.0000 mL | ORAL_SOLUTION | Freq: Four times a day (QID) | ORAL | 0 refills | Status: AC | PRN

## 2022-02-16 NOTE — Assessment & Plan Note (Addendum)
1 day history of symptoms involving rhinorrhea, headache, dry cough, Tmax 101, denies overt shortness of air.  Took a home COVID test which was positive yesterday.  Given medical history, comorbid medical conditions, plan for Paxlovid, as needed Phenergan-DM, low threshold for ER if symptoms fail to improve.

## 2022-02-22 ENCOUNTER — Encounter: Payer: Self-pay | Admitting: Family Medicine

## 2022-02-22 NOTE — Telephone Encounter (Signed)
Please advise 

## 2022-03-01 NOTE — Progress Notes (Signed)
Primary Care / Sports Medicine Virtual Visit  Patient Information:  Patient ID: Debbie Travis, female DOB: 08-08-1985 Age: 37 y.o. MRN: [id number]   Debbie Travis is a pleasant 37 y.o. female presenting with the following:  Chief Complaint  Patient presents with   Covid Positive    Positive yesterday, symptoms started yesterday, runny nose, headache, dry cough, fever 100-101    Review of Systems: No fevers, chills, night sweats, weight loss, chest pain, or shortness of breath.   Patient Active Problem List   Diagnosis Date Noted   COVID 02/16/2022   Abscess of fifth toe, left 01/09/2022   Status post arthroscopy of hip 11/14/2021   Acute frontal sinusitis 10/13/2021   Pneumonia of both lungs due to Mycoplasma pneumoniae 08/16/2021   Bilateral pneumonia 08/15/2021   Hypoxia 08/15/2021   Right calf pain 07/18/2021   Polyarthralgia 07/18/2021   Palpitations 07/18/2021   Closed fracture of hamate bone of right wrist 06/08/2021   Pain in right hand 06/08/2021   Pain in right foot 06/08/2021   Insulin long-term use ([place]) 01/30/2021   Obesity 01/30/2021   Uses self-applied continuous glucose monitoring device 01/30/2021   Acute gastritis 11/14/2020   Arthralgia of left hip 11/14/2020   Pain of right hip joint 10/25/2020   Left lumbosacral radiculopathy 10/25/2020   Diabetes mellitus ([place]) 10/25/2020   Amenorrhea 10/25/2020   Enthesopathy of right hip 10/25/2020   Labral tear of right hip joint 10/25/2020   Type 2 diabetes mellitus without complication ([place]) [id number]   Mitral valve prolapse determined by imaging 01/04/2004   Past Medical History:  Diagnosis Date   Diabetes mellitus without complication ([place])    Hip impingement syndrome, right    Labral tear of right hip joint    Mitral valve prolapse    Outpatient Encounter Medications as of 02/16/2022  Medication Sig   Continuous Blood Gluc Sensor (FREESTYLE LIBRE 3 SENSOR) MISC 1 each by Miscellaneous  route every fourteen (14) days.   HUMALOG KWIKPEN 100 UNIT/ML KwikPen SMARTSIG:0-100 Unit(s) SUB-Q As Directed   Melatonin 5 MG CHEW Chew by mouth.   mupirocin ointment (BACTROBAN) 2 % Apply 1 Application topically daily.   [EXPIRED] nirmatrelvir/ritonavir (PAXLOVID) 20 x 150 MG & 10 x 100MG TABS Take 3 tablets by mouth 2 (two) times daily for 5 days. (Take nirmatrelvir 150 mg two tablets twice daily for 5 days and ritonavir 100 mg one tablet twice daily for 5 days) Patient GFR is 60   promethazine-dextromethorphan (PROMETHAZINE-DM) 6.25-15 MG/5ML syrup Take 5 mLs by mouth 4 (four) times daily as needed for cough.   [DISCONTINUED] fluconazole (DIFLUCAN) 150 MG tablet Take 150 mg by mouth once.   [DISCONTINUED] naproxen (NAPROSYN) 500 MG tablet Take 500 mg by mouth 2 (two) times daily.   [DISCONTINUED] JARDIANCE 25 MG TABS tablet Take 25 mg by mouth daily.   [DISCONTINUED] OZEMPIC, 1 MG/DOSE, 4 MG/3ML SOPN Inject 4 mg into the skin once a week.   [DISCONTINUED] propranolol (INDERAL) 10 MG tablet Take 1 tablet (10 mg total) by mouth 3 (three) times daily as needed.   [DISCONTINUED] sulfamethoxazole-trimethoprim (BACTRIM DS) 800-160 MG tablet Take 1 tablet by mouth 2 (two) times daily.   [DISCONTINUED] TOUJEO MAX SOLOSTAR 300 UNIT/ML Solostar Pen PLEASE SEE ATTACHED FOR DETAILED DIRECTIONS   No facility-administered encounter medications on file as of 02/16/2022.   Past Surgical History:  Procedure Laterality Date   CHOLECYSTECTOMY  03/2003   HIP SURGERY Right 07/12/2020  femoral acetabullar impingement   LEG TENDON SURGERY Left 2004   TONSILLECTOMY Bilateral 09/2007   WISDOM TOOTH EXTRACTION Left 05/2020    Virtual Visit via MyChart Video:   I connected with Debbie Travis on 03/01/22 via MyChart Video and verified that I am speaking with the correct person using appropriate identifiers.   The limitations, risks, security and privacy concerns of performing an evaluation and  management service by MyChart Video, including the higher likelihood of inaccurate diagnoses and treatments, and the availability of in person appointments were reviewed. The possible need of an additional face-to-face encounter for complete and high quality delivery of care was discussed. The patient was also made aware that there may be a patient responsible charge related to this service. The patient expressed understanding and wishes to proceed.  Provider location is in medical facility. Patient location is at their home, different from provider location. People involved in care of the patient during this telehealth encounter were myself, my nurse/medical assistant, and my front office/scheduling team member.  Objective findings:   General: Speaking full sentences, no audible heavy breathing. Sounds alert and appropriately interactive. Well-appearing. Face symmetric. Extraocular movements intact. Pupils equal and round. No nasal flaring or accessory muscle use visualized.  Independent interpretation of notes and tests performed by another provider:   None  Pertinent History, Exam, Impression, and Recommendations:   COVID 1 day history of symptoms involving rhinorrhea, headache, dry cough, Tmax 101, denies overt shortness of air.  Took a home COVID test which was positive yesterday.  Given medical history, comorbid medical conditions, plan for Paxlovid, as needed Phenergan-DM, low threshold for ER if symptoms fail to improve.    Orders & Medications Meds ordered this encounter  Medications   nirmatrelvir/ritonavir (PAXLOVID) 20 x 150 MG & 10 x 100MG TABS    Sig: Take 3 tablets by mouth 2 (two) times daily for 5 days. (Take nirmatrelvir 150 mg two tablets twice daily for 5 days and ritonavir 100 mg one tablet twice daily for 5 days) Patient GFR is 60    Dispense:  30 tablet    Refill:  0   promethazine-dextromethorphan (PROMETHAZINE-DM) 6.25-15 MG/5ML syrup    Sig: Take 5 mLs by  mouth 4 (four) times daily as needed for cough.    Dispense:  118 mL    Refill:  0   No orders of the defined types were placed in this encounter.    I discussed the above assessment and treatment plan with the patient. The patient was provided an opportunity to ask questions and all were answered. The patient agreed with the plan and demonstrated an understanding of the instructions.   The patient was advised to call back or seek an in-person evaluation if the symptoms worsen or if the condition fails to improve as anticipated.   I provided a total time of 30 minutes including both face-to-face and non-face-to-face time on 03/01/2022 inclusive of time utilized for medical chart review, information gathering, care coordination with staff, and documentation completion.    Montel Culver, MD, [facility]   Primary Care Sports Medicine Primary Care and Sports Medicine at [facility]

## 2022-03-08 ENCOUNTER — Encounter: Payer: Self-pay | Admitting: Family Medicine

## 2022-03-09 NOTE — Telephone Encounter (Signed)
Please review.  KP

## 2022-03-12 ENCOUNTER — Ambulatory Visit (INDEPENDENT_AMBULATORY_CARE_PROVIDER_SITE_OTHER): Payer: Managed Care, Other (non HMO)

## 2022-03-12 DIAGNOSIS — E538 Deficiency of other specified B group vitamins: Secondary | ICD-10-CM | POA: Diagnosis not present

## 2022-03-12 MED ORDER — CYANOCOBALAMIN 1000 MCG/ML IJ SOLN
1000.0000 ug | Freq: Once | INTRAMUSCULAR | Status: AC
  Administered 2022-03-12: 1000 ug via INTRAMUSCULAR

## 2022-03-15 ENCOUNTER — Other Ambulatory Visit: Payer: Self-pay | Admitting: Rheumatology

## 2022-03-15 DIAGNOSIS — M064 Inflammatory polyarthropathy: Secondary | ICD-10-CM

## 2022-03-19 ENCOUNTER — Ambulatory Visit (INDEPENDENT_AMBULATORY_CARE_PROVIDER_SITE_OTHER): Payer: Managed Care, Other (non HMO)

## 2022-03-19 DIAGNOSIS — E538 Deficiency of other specified B group vitamins: Secondary | ICD-10-CM

## 2022-03-19 MED ORDER — CYANOCOBALAMIN 1000 MCG/ML IJ SOLN
1000.0000 ug | Freq: Once | INTRAMUSCULAR | Status: AC
  Administered 2022-03-19: 1000 ug via INTRAMUSCULAR

## 2022-03-22 ENCOUNTER — Ambulatory Visit: Admission: RE | Admit: 2022-03-22 | Payer: Managed Care, Other (non HMO) | Source: Ambulatory Visit

## 2022-03-28 ENCOUNTER — Ambulatory Visit: Payer: Managed Care, Other (non HMO)

## 2022-03-28 ENCOUNTER — Ambulatory Visit (INDEPENDENT_AMBULATORY_CARE_PROVIDER_SITE_OTHER): Payer: Managed Care, Other (non HMO)

## 2022-03-28 DIAGNOSIS — E538 Deficiency of other specified B group vitamins: Secondary | ICD-10-CM | POA: Diagnosis not present

## 2022-03-28 MED ORDER — CYANOCOBALAMIN 1000 MCG/ML IJ SOLN
1000.0000 ug | Freq: Once | INTRAMUSCULAR | Status: AC
  Administered 2022-03-28: 1000 ug via INTRAMUSCULAR

## 2022-04-04 ENCOUNTER — Ambulatory Visit: Payer: Managed Care, Other (non HMO) | Attending: Cardiovascular Disease

## 2022-04-05 ENCOUNTER — Ambulatory Visit (INDEPENDENT_AMBULATORY_CARE_PROVIDER_SITE_OTHER): Payer: Managed Care, Other (non HMO) | Admitting: Family Medicine

## 2022-04-05 ENCOUNTER — Ambulatory Visit (INDEPENDENT_AMBULATORY_CARE_PROVIDER_SITE_OTHER): Payer: Managed Care, Other (non HMO)

## 2022-04-05 ENCOUNTER — Encounter: Payer: Self-pay | Admitting: Family Medicine

## 2022-04-05 VITALS — BP 120/80 | HR 77 | Ht 65.0 in | Wt 201.0 lb

## 2022-04-05 DIAGNOSIS — J0140 Acute pansinusitis, unspecified: Secondary | ICD-10-CM

## 2022-04-05 DIAGNOSIS — E538 Deficiency of other specified B group vitamins: Secondary | ICD-10-CM | POA: Diagnosis not present

## 2022-04-05 DIAGNOSIS — G8929 Other chronic pain: Secondary | ICD-10-CM

## 2022-04-05 DIAGNOSIS — M7918 Myalgia, other site: Secondary | ICD-10-CM

## 2022-04-05 DIAGNOSIS — Z7984 Long term (current) use of oral hypoglycemic drugs: Secondary | ICD-10-CM

## 2022-04-05 DIAGNOSIS — J0110 Acute frontal sinusitis, unspecified: Secondary | ICD-10-CM | POA: Diagnosis not present

## 2022-04-05 DIAGNOSIS — Z7985 Long-term (current) use of injectable non-insulin antidiabetic drugs: Secondary | ICD-10-CM

## 2022-04-05 DIAGNOSIS — E559 Vitamin D deficiency, unspecified: Secondary | ICD-10-CM

## 2022-04-05 DIAGNOSIS — E119 Type 2 diabetes mellitus without complications: Secondary | ICD-10-CM

## 2022-04-05 MED ORDER — AZITHROMYCIN 250 MG PO TABS: ORAL_TABLET | ORAL | 0 refills | Status: AC

## 2022-04-05 MED ORDER — DULOXETINE HCL 30 MG PO CPEP: ORAL_CAPSULE | ORAL | 0 refills | Status: DC

## 2022-04-05 MED ORDER — CYANOCOBALAMIN 1000 MCG/ML IJ SOLN
1000.0000 ug | Freq: Once | INTRAMUSCULAR | Status: AC
  Administered 2022-04-05: 1000 ug via INTRAMUSCULAR

## 2022-04-05 NOTE — Progress Notes (Signed)
Primary Care / Sports Medicine Office Visit  Patient Information:  Patient ID: Debbie Travis, female DOB: 1985-06-03 Age: 37 y.o. MRN: [id number]   Debbie Travis is a pleasant 37 y.o. female presenting with the following:  Chief Complaint  Patient presents with   Otalgia    4 days,     Vitals:   04/05/22 1344  BP: 120/80  Pulse: 77  SpO2: 98%   Vitals:   04/05/22 1344  Weight: 201 lb (91.2 kg)  Height: 5\' 5"  (1.651 m)   Body mass index is 33.45 kg/m.  No results found.   Independent interpretation of notes and tests performed by another provider:   None  Procedures performed:   None  Pertinent History, Exam, Impression, and Recommendations:   Debbie Travis was seen today for otalgia.  Acute frontal sinusitis, recurrence not specified Assessment & Plan: Several day history of sinus pressure and 4-day history of bilateral, left greater than right otalgia.  She reports sick contacts, subjective fevers, has been dosing OTC regimen with minimal response.  Examination with diffuse tenderness about the sinuses, left greater than right, left dull and retracted tympanic membrane, right erythematous tympanic membrane, shotty left lymphadenopathy, oropharynx benign.  Plan as follows: - Azithromycin for full course - Supportive care  Orders: -     Azithromycin; Take 2 tablets on day 1, then 1 tablet daily on days 2 through 5  Dispense: 6 tablet; Refill: 0  Chronic musculoskeletal pain Assessment & Plan: Chronic musculoskeletal pain in the setting of right hip labral derangement status post surgical correction, ongoing symptomatology with concern for intervertebral derangement as raised by rheumatology.  While they pursue additional evaluation and management patient is amenable to the following:  - Initiate duloxetine 30 mg x 1 week and titrate to 60 mg daily until follow-up - Follow-up in 1 month  Orders: -     DULoxetine HCl; Take 1 capsule (30 mg total)  by mouth every evening for 7 days, THEN 2 capsules (60 mg total) every evening for 24 days.  Dispense: 55 capsule; Refill: 0  B12 deficiency Assessment & Plan: B12 administered today, lab recheck ordered  Orders: -     Vitamin B12 -     CBC with Differential/Platelet  Vitamin D deficiency Assessment & Plan: On supplementation, repeat labs ordered.  Orders: -     VITAMIN D 25 Hydroxy (Vit-D Deficiency, Fractures) -     CBC with Differential/Platelet  Acute pansinusitis, recurrence not specified  Diabetes mellitus without complication (HCC) -     Microalbumin / creatinine urine ratio -     Comprehensive metabolic panel     Orders & Medications Meds ordered this encounter  Medications   azithromycin (ZITHROMAX) 250 MG tablet    Sig: Take 2 tablets on day 1, then 1 tablet daily on days 2 through 5    Dispense:  6 tablet    Refill:  0   DULoxetine (CYMBALTA) 30 MG capsule    Sig: Take 1 capsule (30 mg total) by mouth every evening for 7 days, THEN 2 capsules (60 mg total) every evening for 24 days.    Dispense:  55 capsule    Refill:  0   Orders Placed This Encounter  Procedures   VITAMIN D 25 Hydroxy (Vit-D Deficiency, Fractures)   Vitamin B12   CBC with Differential/Platelet   Urine Microalbumin w/creat. ratio   Comprehensive Metabolic Panel (CMET)     Return in about 4 weeks (  around 05/03/2022) for f/u new medication, lab results.     Montel Culver, MD, [facility]   Primary Care Sports Medicine Primary Care and Sports Medicine at [facility]

## 2022-04-05 NOTE — Assessment & Plan Note (Signed)
B12 administered today, lab recheck ordered

## 2022-04-05 NOTE — Assessment & Plan Note (Signed)
Several day history of sinus pressure and 4-day history of bilateral, left greater than right otalgia.  She reports sick contacts, subjective fevers, has been dosing OTC regimen with minimal response.  Examination with diffuse tenderness about the sinuses, left greater than right, left dull and retracted tympanic membrane, right erythematous tympanic membrane, shotty left lymphadenopathy, oropharynx benign.  Plan as follows: - Azithromycin for full course - Supportive care

## 2022-04-05 NOTE — Assessment & Plan Note (Signed)
Chronic musculoskeletal pain in the setting of right hip labral derangement status post surgical correction, ongoing symptomatology with concern for intervertebral derangement as raised by rheumatology.  While they pursue additional evaluation and management patient is amenable to the following:  - Initiate duloxetine 30 mg x 1 week and titrate to 60 mg daily until follow-up - Follow-up in 1 month

## 2022-04-05 NOTE — Assessment & Plan Note (Signed)
On supplementation, repeat labs ordered.

## 2022-04-05 NOTE — Patient Instructions (Signed)
-   Obtain labs today - Take antibiotics for full course - Start Cymbalta (duloxetine) daily x 7 days - After 7 days take daily until follow-up - Return for follow-up in 1 month

## 2022-04-18 LAB — COMPREHENSIVE METABOLIC PANEL
ALT: 27 IU/L (ref 0–32)
AST: 20 IU/L (ref 0–40)
Albumin/Globulin Ratio: 2.1 (ref 1.2–2.2)
Albumin: 4.5 g/dL (ref 3.9–4.9)
Alkaline Phosphatase: 110 IU/L (ref 44–121)
BUN/Creatinine Ratio: 7 — ABNORMAL LOW (ref 9–23)
BUN: 5 mg/dL — ABNORMAL LOW (ref 6–20)
Bilirubin Total: 0.4 mg/dL (ref 0.0–1.2)
CO2: 24 mmol/L (ref 20–29)
Calcium: 9.7 mg/dL (ref 8.7–10.2)
Chloride: 100 mmol/L (ref 96–106)
Creatinine, Ser: 0.67 mg/dL (ref 0.57–1.00)
Globulin, Total: 2.1 g/dL (ref 1.5–4.5)
Glucose: 339 mg/dL — ABNORMAL HIGH (ref 70–99)
Potassium: 4.2 mmol/L (ref 3.5–5.2)
Sodium: 141 mmol/L (ref 134–144)
Total Protein: 6.6 g/dL (ref 6.0–8.5)
eGFR: 116 mL/min/{1.73_m2} (ref >59)

## 2022-04-18 LAB — CBC WITH DIFFERENTIAL/PLATELET
Basophils Absolute: 0 10*3/uL (ref 0.0–0.2)
Basos: 0 %
EOS (ABSOLUTE): 0.1 10*3/uL (ref 0.0–0.4)
Eos: 2 %
Hematocrit: 43.4 % (ref 34.0–46.6)
Hemoglobin: 14.3 g/dL (ref 11.1–15.9)
Immature Grans (Abs): 0 10*3/uL (ref 0.0–0.1)
Immature Granulocytes: 0 %
Lymphocytes Absolute: 2.7 10*3/uL (ref 0.7–3.1)
Lymphs: 28 %
MCH: 29.4 pg (ref 26.6–33.0)
MCHC: 32.9 g/dL (ref 31.5–35.7)
MCV: 89 fL (ref 79–97)
Monocytes Absolute: 0.5 10*3/uL (ref 0.1–0.9)
Monocytes: 5 %
Neutrophils Absolute: 6.1 10*3/uL (ref 1.4–7.0)
Neutrophils: 65 %
Platelets: 359 10*3/uL (ref 150–450)
RBC: 4.87 x10E6/uL (ref 3.77–5.28)
RDW: 12.7 % (ref 11.7–15.4)
WBC: 9.5 10*3/uL (ref 3.4–10.8)

## 2022-04-18 LAB — MICROALBUMIN / CREATININE URINE RATIO
Creatinine, Urine: 104.3 mg/dL
Microalb/Creat Ratio: 3 mg/g creat (ref 0–29)
Microalbumin, Urine: 3.5 ug/mL

## 2022-04-18 LAB — VITAMIN B12: Vitamin B-12: 663 pg/mL (ref 232–1245)

## 2022-04-18 LAB — VITAMIN D 25 HYDROXY (VIT D DEFICIENCY, FRACTURES): Vit D, 25-Hydroxy: 26 ng/mL — ABNORMAL LOW (ref 30.0–100.0)

## 2022-04-27 ENCOUNTER — Other Ambulatory Visit: Payer: Self-pay | Admitting: Family Medicine

## 2022-04-27 DIAGNOSIS — M7918 Myalgia, other site: Secondary | ICD-10-CM

## 2022-04-27 NOTE — Telephone Encounter (Signed)
Requested medication (s) are due for refill today: yes  Requested medication (s) are on the active medication list: yes  Last refill:  04/05/22 05/06/22 #55 0 refills   Future visit scheduled: yes in 1 week   Notes to clinic:  tapered drug . Pharmacy comment: REQUEST FOR 90 DAYS PRESCRIPTION. DX Code Needed.        Requested Prescriptions  Pending Prescriptions Disp Refills   DULoxetine (CYMBALTA) 30 MG capsule [Pharmacy Med Name: DULOXETINE HCL DR 30 MG CAP] 165 capsule 1    Sig: TAKE 1 CAP BY MOUTH EVERY EVENING FOR 7 DAYS, THEN 2 CAP (60 MG TOTAL) EVERY EVENING FOR 24 DAYS.     Psychiatry: Antidepressants - SNRI - duloxetine Passed - 04/27/2022  2:33 PM      Passed - Cr in normal range and within 360 days    Creatinine, Ser  Date Value Ref Range Status  04/17/2022 0.67 0.57 - 1.00 mg/dL Final         Passed - eGFR is 30 or above and within 360 days    GFR, Estimated  Date Value Ref Range Status  08/14/2021 >60 >60 mL/min Final    Comment:    (NOTE) Calculated using the CKD-EPI Creatinine Equation (2021)    eGFR  Date Value Ref Range Status  04/17/2022 116 >59 mL/min/1.73 Final         Passed - Completed PHQ-2 or PHQ-9 in the last 360 days      Passed - Last BP in normal range    BP Readings from Last 1 Encounters:  04/05/22 120/80         Passed - Valid encounter within last 6 months    Recent Outpatient Visits           3 weeks ago Acute frontal sinusitis, recurrence not specified   [place] [facility] at [facility], Ocie Bob, MD   2 months ago COVID   [facility] at [facility], Ocie Bob, MD   3 months ago Abscess of fifth toe, left   [place] [facility] & [facility] Medicine at [facility], Ocie Bob, MD   6 months ago Acute frontal sinusitis, recurrence not specified   [place] Primary Care & Sports Medicine at [facility], Ocie Bob, MD   8 months  ago Pneumonia of both lungs due to Mycoplasma pneumoniae, unspecified part of lung   [facility] Care & Sports Medicine at [facility], Ocie Bob, MD       Future Appointments             In 1 week Ashley Royalty, Ocie Bob, MD [facility] Primary Care & Sports Medicine at [facility], [facility]   In 3 months Ashley Royalty, Ocie Bob, MD [facility] Primary Care & Sports Medicine at [facility], [facility]

## 2022-04-30 ENCOUNTER — Other Ambulatory Visit: Payer: Self-pay | Admitting: Family Medicine

## 2022-04-30 MED ORDER — VITAMIN D (ERGOCALCIFEROL) 1.25 MG (50000 UNIT) PO CAPS: 50000.0000 [IU] | ORAL_CAPSULE | ORAL | 0 refills | Status: AC

## 2022-05-07 ENCOUNTER — Ambulatory Visit: Payer: Managed Care, Other (non HMO) | Admitting: Family Medicine

## 2022-05-31 ENCOUNTER — Encounter: Payer: Self-pay | Admitting: Family Medicine

## 2022-05-31 ENCOUNTER — Other Ambulatory Visit: Payer: Self-pay | Admitting: Family Medicine

## 2022-06-01 ENCOUNTER — Other Ambulatory Visit: Payer: Self-pay

## 2022-06-01 DIAGNOSIS — B379 Candidiasis, unspecified: Secondary | ICD-10-CM

## 2022-06-01 MED ORDER — FLUCONAZOLE 150 MG PO TABS: 150.0000 mg | ORAL_TABLET | Freq: Once | ORAL | 0 refills | Status: AC

## 2022-06-01 NOTE — Telephone Encounter (Signed)
Requested medications are due for refill today.  unsure  Requested medications are on the active medications list.  no  Last refill. 01/09/2022   Future visit scheduled.   yes  Notes to clinic.  Medication not assigned to a protocol, please review for refill.    Requested Prescriptions  Pending Prescriptions Disp Refills   fluconazole (DIFLUCAN) 150 MG tablet [Pharmacy Med Name: FLUCONAZOLE 150 MG TABLET] 1 tablet 1    Sig: Take 1 tablet (150 mg total) by mouth once for 1 dose.     Off-Protocol Failed - 05/31/2022  7:17 PM      Failed - Medication not assigned to a protocol, review manually.      Passed - Valid encounter within last 12 months    Recent Outpatient Visits           1 month ago Acute frontal sinusitis, recurrence not specified   [place] [facility] Medicine at [facility], Ocie Bob, MD   3 months ago COVID   [facility] Care & Sports Medicine at [facility], Ocie Bob, MD   4 months ago Abscess of fifth toe, left   [place] [facility] Medicine at [facility], Ocie Bob, MD   7 months ago Acute frontal sinusitis, recurrence not specified   [place] Primary Care & Sports Medicine at [facility], Ocie Bob, MD   9 months ago Pneumonia of both lungs due to Mycoplasma pneumoniae, unspecified part of lung   [facility] Primary Care & Sports Medicine at [facility], Ocie Bob, MD       Future Appointments             In 2 months Ashley Royalty, Ocie Bob, MD [facility] Primary Care & Sports Medicine at [facility], [facility]

## 2022-06-01 NOTE — Progress Notes (Signed)
Sent in diflucan to [facility] for pt- Debbie Travis out of office

## 2022-06-05 ENCOUNTER — Ambulatory Visit: Payer: Managed Care, Other (non HMO) | Attending: Cardiovascular Disease

## 2022-06-18 ENCOUNTER — Other Ambulatory Visit: Payer: Self-pay | Admitting: Family Medicine

## 2022-06-18 DIAGNOSIS — M7918 Myalgia, other site: Secondary | ICD-10-CM

## 2022-06-19 NOTE — Telephone Encounter (Signed)
Requested Prescriptions  Pending Prescriptions Disp Refills   DULoxetine (CYMBALTA) 30 MG capsule [Pharmacy Med Name: DULOXETINE HCL DR 30 MG CAP] 180 capsule 0    Sig: TAKE 1 CAP BY MOUTH EVERY EVENING FOR 7 DAYS, THEN 2 CAP (60 MG TOTAL) EVERY EVENING FOR 24 DAYS.     Psychiatry: Antidepressants - SNRI - duloxetine Passed - 06/18/2022 10:17 AM      Passed - Cr in normal range and within 360 days    Creatinine, Ser  Date Value Ref Range Status  04/17/2022 0.67 0.57 - 1.00 mg/dL Final         Passed - eGFR is 30 or above and within 360 days    GFR, Estimated  Date Value Ref Range Status  08/14/2021 >60 >60 mL/min Final    Comment:    (NOTE) Calculated using the CKD-EPI Creatinine Equation (2021)    eGFR  Date Value Ref Range Status  04/17/2022 116 >59 mL/min/1.73 Final         Passed - Completed PHQ-2 or PHQ-9 in the last 360 days      Passed - Last BP in normal range    BP Readings from Last 1 Encounters:  04/05/22 120/80         Passed - Valid encounter within last 6 months    Recent Outpatient Visits           2 months ago Acute frontal sinusitis, recurrence not specified   [place] [facility] Medicine at [facility], Ocie Bob, MD   4 months ago COVID   [facility] Primary Care & Sports Medicine at [facility], Ocie Bob, MD   5 months ago Abscess of fifth toe, left   [place] [facility] Care & Sports Medicine at [facility], Ocie Bob, MD   8 months ago Acute frontal sinusitis, recurrence not specified   [place] Primary Care & Sports Medicine at [facility] Ashley Royalty, Ocie Bob, MD   10 months ago Pneumonia of both lungs due to Mycoplasma pneumoniae, unspecified part of lung   [facility] Primary Care & Sports Medicine at [facility], Ocie Bob, MD       Future Appointments             In 2 months Ashley Royalty, Ocie Bob, MD [facility] Primary Care & Sports Medicine at [facility], [facility]

## 2022-07-30 ENCOUNTER — Inpatient Hospital Stay: Admit: 2022-07-30 | Discharge: 2022-07-30 | Payer: PRIVATE HEALTH INSURANCE

## 2022-08-15 ENCOUNTER — Ambulatory Visit
Admit: 2022-08-15 | Discharge: 2022-08-20 | Payer: PRIVATE HEALTH INSURANCE | Attending: Student in an Organized Health Care Education/Training Program | Primary: Student in an Organized Health Care Education/Training Program

## 2022-08-15 ENCOUNTER — Inpatient Hospital Stay
Admit: 2022-08-15 | Payer: PRIVATE HEALTH INSURANCE | Primary: Student in an Organized Health Care Education/Training Program

## 2022-08-15 DIAGNOSIS — Z7689 Persons encountering health services in other specified circumstances: Secondary | ICD-10-CM

## 2022-08-15 LAB — CBC
Hematocrit: 39.3 % (ref 35.0–45.0)
Hemoglobin: 12.5 g/dL (ref 12.0–16.0)
MCH: 28.5 PG (ref 24.0–34.0)
MCHC: 31.8 g/dL (ref 31.0–37.0)
MCV: 89.7 FL (ref 78.0–100.0)
MPV: 10.3 FL (ref 9.2–11.8)
Nucleated RBCs: 0 PER 100 WBC
Platelets: 365 10*3/uL (ref 135–420)
RBC: 4.38 M/uL (ref 4.20–5.30)
RDW: 13.1 % (ref 11.6–14.5)
WBC: 9.2 10*3/uL (ref 4.6–13.2)
nRBC: 0 10*3/uL (ref 0.00–0.01)

## 2022-08-15 LAB — LIPID PANEL
Chol/HDL Ratio: 3.8 (ref 0–5.0)
Cholesterol, Total: 183 MG/DL (ref <200)
HDL: 48 MG/DL (ref 40–60)
LDL Cholesterol: 116.2 MG/DL — ABNORMAL HIGH (ref 0–100)
Triglycerides: 94 MG/DL (ref <150)
VLDL Cholesterol Calculated: 18.8 MG/DL

## 2022-08-15 LAB — COMPREHENSIVE METABOLIC PANEL
ALT: 16 U/L (ref 13–56)
AST: 13 U/L (ref 10–38)
Albumin/Globulin Ratio: 1.3 (ref 0.8–1.7)
Albumin: 3.6 g/dL (ref 3.4–5.0)
Alk Phosphatase: 94 U/L (ref 45–117)
Anion Gap: 2 mmol/L — ABNORMAL LOW (ref 3.0–18)
BUN/Creatinine Ratio: 6 — ABNORMAL LOW (ref 12–20)
BUN: 4 MG/DL — ABNORMAL LOW (ref 7.0–18)
CO2: 32 mmol/L (ref 21–32)
Calcium: 8.9 MG/DL (ref 8.5–10.1)
Chloride: 108 mmol/L (ref 100–111)
Creatinine: 0.66 MG/DL (ref 0.6–1.3)
Est, Glom Filt Rate: >90 mL/min/{1.73_m2} (ref >60)
Globulin: 2.7 g/dL (ref 2.0–4.0)
Glucose: 205 mg/dL — ABNORMAL HIGH (ref 74–99)
Potassium: 4 mmol/L (ref 3.5–5.5)
Sodium: 142 mmol/L (ref 136–145)
Total Bilirubin: 0.4 MG/DL (ref 0.2–1.0)
Total Protein: 6.3 g/dL — ABNORMAL LOW (ref 6.4–8.2)

## 2022-08-15 LAB — HEMOGLOBIN A1C
Estimated Avg Glucose: 278 mg/dL
Hemoglobin A1C: 11.3 % — ABNORMAL HIGH (ref 4.2–5.6)

## 2022-08-15 MED ORDER — AZELASTINE HCL 0.1 % NA SOLN
0.1 | Freq: Two times a day (BID) | NASAL | 1 refills | Status: AC
Start: 2022-08-15 — End: ?

## 2022-08-15 MED ORDER — LEVOCETIRIZINE DIHYDROCHLORIDE 5 MG PO TABS
5 MG | ORAL_TABLET | Freq: Every evening | ORAL | 1 refills | Status: AC
Start: 2022-08-15 — End: ?

## 2022-08-15 MED ORDER — LINACLOTIDE 72 MCG PO CAPS
72 | ORAL_CAPSULE | Freq: Every day | ORAL | 0 refills | Status: AC
Start: 2022-08-15 — End: ?

## 2022-08-15 MED ORDER — SENNOSIDES-DOCUSATE SODIUM 8.6-50 MG PO TABS
8.6-50 MG | ORAL_TABLET | Freq: Every day | ORAL | 3 refills | Status: AC
Start: 2022-08-15 — End: ?

## 2022-08-15 MED ORDER — HUMULIN R U-500 KWIKPEN 500 UNIT/ML SC SOPN
500 UNIT/ML | SUBCUTANEOUS | 5 refills | Status: AC
Start: 2022-08-15 — End: ?

## 2022-08-15 NOTE — Progress Notes (Signed)
Tara Browning is a 37 y.o. year old female who presents today for   Chief Complaint   Patient presents with    New Patient       Is someone accompanying this pt? No     Is the patient using any DME equipment during OV? No     Depression Screening:       08/15/2022     9:12 AM   PHQ-9 Questionaire   Little interest or pleasure in doing things 0   Feeling down, depressed, or hopeless 0   PHQ-9 Total Score 0       Abuse Screening:      08/15/2022     9:00 AM   AMB Abuse Screening   Do you ever feel afraid of your partner? N   Are you in a relationship with someone who physically or mentally threatens you? N   Is it safe for you to go home? Y       Learning Assessment:  Who is the primary learner? Patient    What is the preferred language for health care of the primary learner? ENGLISH    How does the primary learner prefer to learn new concepts? READING    How does the primary learner prefer to learn new concepts? DEMONSTRATION    Answered By patient    Relationship to Learner SELF    Highest level of education completed by primary learner? > 4 YEARS OF COLLEGE    Are there any barriers / factors that could impact learning? NONE    Will there be a co-learner / caregiver? No        Fall Risk:       No data to display                    Coordination of Care:   1. "Have you been to the ER, urgent care clinic since your last visit?  Hospitalized since your last visit?" Yes     2. "Have you seen or consulted any other health care providers outside of the [facility] since your last visit?" No     3. For patients aged 58-75: Has the patient had a colonoscopy / FIT/ Cologuard? Not due     If the patient is female:    4. For patients aged 69-74: Has the patient had a mammogram within the past 2 years? Not due     5. For patients aged 21-65: Has the patient had a pap smear? Due     Health Maintenance: reviewed and discussed and ordered per Provider.    Health Maintenance Due   Topic Date Due    Hepatitis B  vaccine (1 of 3 - 3-dose series) Never done    Varicella vaccine (1 of 2 - 2-dose childhood series) Never done    Depression Screen  Never done    HIV screen  Never done    Hepatitis C screen  Never done    DTaP/Tdap/Td vaccine (1 - Tdap) Never done    Cervical cancer screen  Never done    COVID-19 Vaccine (4 - 2023-24 season) 09/08/2021    Flu vaccine (1) 08/09/2022        -Prudy Feeler, LPN  [facility]  Phone: [phone]  Fax: [phone]

## 2022-08-15 NOTE — Assessment & Plan Note (Signed)
-   uncontrolled;   - OTC therapy not effective  - trial of xyzal and azelastine

## 2022-08-15 NOTE — Patient Instructions (Signed)
It was nice meeting you today.  Please have your labs done either immediately after this visit, or you can schedule at the front desk to come back for labs.  Please do your labs at least a week before your next appointment.    If you have questions or concerns, My Chart is the fastest way to get in touch with me and the clinical staff.    Keep in mind that [employer] schedules most appointments to last 15 minutes. If you have a new problem or multiple problems you can request 30 minutes.  If you arrive more than halfway through your scheduled appointment, staff may offer to reschedule you. This is to ensure you and the provider have enough time to complete a high quality encounter.  Please arrive to your appointments at least 10 minutes early to avoid any unforseen delays.     If you have trouble paying medical bills, please consider contacting [employer].  [employer] patient representatives are available to discuss government programs that provide assistance with medical bills to qualifying individuals and their families.  The services are provided free of charge to patients in need of assistance.  [employer] patient representatives can also assist you in completing and registering applications with appropriate agencies.  Please call the following number if you are interested: [phone].    For constipation: first try the Pericolace. If that does not work after two days, try the Linzess. If that does not work, you may need an enema at the ED.  Keep well-hydrated and continue the fiber.

## 2022-08-15 NOTE — Assessment & Plan Note (Signed)
-   type 1 vs 2  - continue current humulin regimen  - endo referral  - A1C today

## 2022-08-15 NOTE — Assessment & Plan Note (Addendum)
-   uncontrolled; due to insulin  - refractory to OTC options  - trial of pericolace; Linzess if necessary  - continue fluids and fiber  - discussed criteria for ED visit for enema

## 2022-08-15 NOTE — Progress Notes (Signed)
History of Present Illness  Tara Browning is a 37 y.o. female who presents today for management of    Chief Complaint   Patient presents with    New Patient     CC: new patient here for healthcare maintenance    -Patient is here to establish care.   -Previous PCP: [facility]; in care everywhere)   -Employment: Community education officer  -She lives at home with son, parents, two brother, sister-in-law, and brother's kids     Medical History:  Early menopause  - not on any hormone replacement  - no gyn here yet    ?T2DM  - started insulin at age 86;  - humilin u500; Take 60 units daily when fasting BG <275. Take 70 units daily when fasting BG >275    URI concern  - nasal congestion and drainage x 1 month  - zyrtec, advil sinus not helpful  - reports stuffy uncomfortable ears and pressure in forehead and cheeks  - denies fever    Constipation  - last BM was small this AM; sxs x 2 weeks  - taken mag citrate, generic stool softeners, fiber gummies, enema all without benefit  - believes her insulin is responsible  - hx cholecystectomy    Social  - EtOH: rare  - Tobacco: no  - Illicit drugs: no    Healthcare maintenance, patient reported:  Vaccine records requested from previous PCP/pharmacy if available  Last Colonoscopy: mother with appendix CA (2021) at age 10  Last Pap: 2022 @ [facility] or with [facility]  Last Mammo: no FHx  Birth control: supposed to be on hormones       Social Determinants of Health     Tobacco Use: Low Risk  (08/15/2022)    Patient History     Smoking Tobacco Use: Never     Smokeless Tobacco Use: Never     Passive Exposure: Not on file   Alcohol Use: Not on file   Financial Resource Strain: Low Risk  (08/15/2022)    Overall Financial Resource Strain (CARDIA)     Difficulty of Paying Living Expenses: Not hard at all   Food Insecurity: No Food Insecurity (08/15/2022)    Hunger Vital Sign     Worried About Running Out of Food in the Last Year: Never true     Ran Out of Food in the Last Year: Never true    Transportation Needs: Unknown (08/15/2022)    PRAPARE - Therapist, art (Medical): Not on file     Lack of Transportation (Non-Medical): No   Physical Activity: Not on file   Stress: Not on file   Social Connections: Not on file   Intimate Partner Violence: Not on file   Depression: Not at risk (08/15/2022)    PHQ-2     PHQ-2 Score: 0   Housing Stability: Unknown (08/15/2022)    Housing Stability Vital Sign     Unable to Pay for Housing in the Last Year: Not on file     Number of Places Lived in the Last Year: Not on file     Unstable Housing in the Last Year: No   Interpersonal Safety: Not on file   Utilities: Not on file              08/15/2022     9:12 AM   PHQ-9    Little interest or pleasure in doing things 0   Feeling down, depressed, or hopeless  0   PHQ-2 Score 0   PHQ-9 Total Score 0       Past Medical History  No past medical history on file.  Surgical History  No past surgical history on file.     Current Outpatient Medications   Medication Sig    insulin regular human (HUMULIN R U-500 KWIKPEN) 500 UNIT/ML SOPN concentrated injection pen Take 60 units daily when fasting BG <275. Take 70 units daily when fasting BG >275    senna-docusate (PERICOLACE) 8.6-50 MG per tablet Take 1 tablet by mouth daily    linaCLOtide (LINZESS) 72 MCG CAPS capsule Take 1 capsule by mouth every morning (before breakfast)    levocetirizine (XYZAL) 5 MG tablet Take 1 tablet by mouth nightly    azelastine (ASTELIN) 0.1 % nasal spray 2 sprays by Nasal route 2 times daily Use in each nostril as directed     No current facility-administered medications for this visit.       Allergies   Allergen Reactions    Penicillins Anaphylaxis      Family History  No family history on file.     Social History     Tobacco Use    Smoking status: Never    Smokeless tobacco: Never   Substance Use Topics    Alcohol use: Yes    Drug use: Never      Health Maintenance   Topic Date Due    Hepatitis B vaccine (1 of 3 - 3-dose series)  Never done    Varicella vaccine (1 of 2 - 2-dose childhood series) Never done    Pneumococcal 0-64 years Vaccine (1 of 2 - PCV) Never done    Diabetic foot exam  Never done    A1C test (Diabetic or Prediabetic)  Never done    Lipids  Never done    HIV screen  Never done    Diabetic Alb to Cr ratio (uACR) test  Never done    Diabetic retinal exam  Never done    GFR test (Diabetes, CKD 3-4, OR last GFR 15-59)  Never done    Hepatitis C screen  Never done    DTaP/Tdap/Td vaccine (1 - Tdap) Never done    Cervical cancer screen  Never done    COVID-19 Vaccine (4 - 2023-24 season) 09/08/2021    Flu vaccine (1) 08/09/2022    Depression Screen  08/15/2023    Hepatitis A vaccine  Aged Out    Hib vaccine  Aged Out    HPV vaccine  Aged Out    Polio vaccine  Aged Out    Meningococcal (ACWY) vaccine  Aged [employer] History   Administered Date(s) Administered    COVID-19, J&J, (age 18y+), IM, 0.5 mL 04/16/2019    COVID-19, MODERNA Bivalent, (age 12y+), IM, 50 mcg/0.5 mL 10/24/2020       Review of Systems  As stated in HPI above    Physical Exam  Vital signs:   Vitals:    08/15/22 0913   BP: 111/76   Site: Right Upper Arm   Position: Sitting   Cuff Size: Large Adult   Pulse: 65   Resp: 10   Temp: 97.5 F (36.4 C)   TempSrc: Temporal   SpO2: 97%   Weight: 94.5 kg (208 lb 6.4 oz)   Height: 1.651 m (5\' 5" )       Physical Exam  Vitals and nursing note reviewed.   Constitutional:  Appearance: She is not ill-appearing.   HENT:      Head:      Comments: Minor frontal and maxillary sinus tenderness to percussion     Right Ear: Tympanic membrane, ear canal and external ear normal. There is no impacted cerumen.      Left Ear: Ear canal and external ear normal. There is no impacted cerumen.      Ears:      Comments: - could not visualize L TM due to tortuous canal     Nose: Congestion and rhinorrhea present.      Mouth/Throat:      Mouth: Mucous membranes are moist.      Pharynx: No oropharyngeal exudate or posterior  oropharyngeal erythema.   Eyes:      General:         Right eye: No discharge.         Left eye: No discharge.      Conjunctiva/sclera: Conjunctivae normal.   Cardiovascular:      Rate and Rhythm: Normal rate and regular rhythm.      Pulses: Normal pulses.      Heart sounds: Normal heart sounds.      Comments: - no pedal edema  Pulmonary:      Effort: Pulmonary effort is normal.      Breath sounds: Normal breath sounds.   Neurological:      Mental Status: She is alert and oriented to person, place, and time. Mental status is at baseline.   Psychiatric:         Mood and Affect: Mood normal.           Assessment/Plan:    1. Encounter to establish care with new doctor  -     Comprehensive Metabolic Panel; Future  -     CBC; Future  -     Hemoglobin A1C; Future  -     Lipid Panel; Future  2. Encounter for screening for diabetes mellitus  -     Hemoglobin A1C; Future  3. Encounter for screening for coronary artery disease  -     Hemoglobin A1C; Future  -     Lipid Panel; Future  4. Screening cholesterol level  -     Lipid Panel; Future  5. Encounter for screening for HIV  -     HIV 1/2 Ag/Ab, 4TH Generation,W Rflx Confirm; Future  6. Encounter for hepatitis C screening test for low risk patient  -     Hepatitis C Ab, Rflx to Qt by PCR; Future  7. Class 1 obesity due to excess calories with serious comorbidity and body mass index (BMI) of 34.0 to 34.9 in adult  -     Comprehensive Metabolic Panel; Future  -     Hemoglobin A1C; Future  -     Lipid Panel; Future  8. Menopause, premature  Assessment & Plan:  - no HRT at this time, although her past gyn encouraged it  - gyn referral   Orders:  -     External Referral To Gynecology  9. Type 2 diabetes mellitus without complication, with long-term current use of insulin (HCC)  Assessment & Plan:  - type 1 vs 2  - continue current humulin regimen  - endo referral  - A1C today   Orders:  -     insulin regular human (HUMULIN R U-500 KWIKPEN) 500 UNIT/ML SOPN concentrated injection  pen; Take 60 units daily when fasting BG <275. Take 70 units  daily when fasting BG >275, Disp-2 each, R-5Normal  -     Amb External Referral To Endocrinology  10. Drug-induced constipation  Assessment & Plan:  - uncontrolled; due to insulin  - refractory to OTC options  - trial of pericolace; Linzess if necessary  - continue fluids and fiber  - discussed criteria for ED visit for enema    Orders:  -     senna-docusate (PERICOLACE) 8.6-50 MG per tablet; Take 1 tablet by mouth daily, Disp-30 tablet, R-3Normal  -     linaCLOtide (LINZESS) 72 MCG CAPS capsule; Take 1 capsule by mouth every morning (before breakfast), Disp-30 capsule, R-0Normal  11. Seasonal allergic rhinitis due to pollen  Assessment & Plan:  - uncontrolled;   - OTC therapy not effective  - trial of xyzal and azelastine   Orders:  -     levocetirizine (XYZAL) 5 MG tablet; Take 1 tablet by mouth nightly, Disp-30 tablet, R-1Normal  -     azelastine (ASTELIN) 0.1 % nasal spray; 2 sprays by Nasal route 2 times daily Use in each nostril as directed, Disp-120 mL, R-1Normal      Follow-up and Dispositions    Return in about 2 weeks (around 08/29/2022) for lab results.         On this date 08/15/2022 I have spent 35 minutes reviewing previous notes, test results and face to face with the patient discussing the diagnosis and importance of compliance with the treatment plan as well as documenting on the day of the visit.    I have discussed the diagnosis with the patient and the intended plan as seen in the above orders.  The patient has received an after-visit summary and questions were answered concerning future plans.  I have discussed medication side effects and warnings with the patient as well. I have reviewed the plan of care with the patient, accepted their input and they are in agreement with the treatment goals.     Burley Saver, MD  August 15, 2022

## 2022-08-15 NOTE — Assessment & Plan Note (Signed)
-   no HRT at this time, although her past gyn encouraged it  - gyn referral

## 2022-08-16 LAB — HEPATITIS C AB, RFLX TO QT BY PCR: Hepatitis C Ab: NONREACTIVE s/co ratio

## 2022-08-16 LAB — HIV 1/2 AG/AB, 4TH GENERATION,W RFLX CONFIRM: HIV 1/2 Interp: NONREACTIVE

## 2022-08-16 LAB — HCV INTERPRETATION

## 2022-08-22 ENCOUNTER — Encounter: Payer: Managed Care, Other (non HMO) | Admitting: Family Medicine

## 2022-08-28 ENCOUNTER — Encounter: Attending: Internal Medicine | Primary: Student in an Organized Health Care Education/Training Program

## 2022-08-29 ENCOUNTER — Encounter
Payer: PRIVATE HEALTH INSURANCE | Attending: Student in an Organized Health Care Education/Training Program | Primary: Student in an Organized Health Care Education/Training Program

## 2022-10-09 ENCOUNTER — Telehealth
Admit: 2022-10-09 | Payer: PRIVATE HEALTH INSURANCE | Attending: Student in an Organized Health Care Education/Training Program | Primary: Student in an Organized Health Care Education/Training Program

## 2022-10-09 DIAGNOSIS — J019 Acute sinusitis, unspecified: Secondary | ICD-10-CM

## 2022-10-09 MED ORDER — DOXYCYCLINE HYCLATE 100 MG PO TABS
100 | ORAL_TABLET | Freq: Two times a day (BID) | ORAL | 0 refills | Status: AC
Start: 2022-10-09 — End: 2022-10-19

## 2022-10-09 NOTE — Assessment & Plan Note (Addendum)
-   A1C 11.3; endo has not called yet  - provided pt with phone number to schedule

## 2022-10-09 NOTE — Progress Notes (Signed)
 Tara Browning, was evaluated through a synchronous (real-time) audio-video encounter. The patient (or guardian if applicable) is aware that this is a billable service, which includes applicable co-pays. This Virtual Visit was conducted with patient's (and/or legal guardian's) consent. Patient identification was verified, and a caregiver was present when appropriate.   The patient was located at Home: [address]  [place] [address]  Provider was located at [employer] (Appt Dept): [address]  [place],  [place] [address]  Confirm you are appropriately licensed, registered, or certified to deliver care in the state where the patient is located as indicated above. If you are not or unsure, please re-schedule the visit: Yes, I confirm.     Tara Browning (DOB:  Sep 10, 1985) is a Established patient, presenting virtually for evaluation of the following:      Below is the assessment and plan developed based on review of pertinent history, physical exam, labs, studies, and medications.     Assessment & Plan  Acute bacterial sinusitis     - 7 days of sinus sxs without improvement; instructed pt to start ABX if sxs do not improve after 10 days of sxs  - cannot take sudafed given cardiac hx; recommended afrin, 3 days max  Orders:    doxycycline  hyclate (VIBRA -TABS) 100 MG tablet; Take 1 tablet by mouth 2 times daily for 10 days    Type 2 diabetes mellitus without complication, with long-term current use of insulin  (HCC)  - A1C 11.3; endo has not called yet  - provided pt with phone number to schedule            Return in about 3 months (around 01/09/2023) for Diabetes.       Subjective   HPI  Review of Systems     URI  - congestion, postnasal drainage resulting in cough x 7 days  - OTC meds, azelastine , xyzal  not effective     T2DM  - endo has not called yet    HM  - gyn in November    Objective   Patient-Reported Vitals  No data recorded     Physical Exam  A&O x 3  NAD  Speaking in full  sentences  NL hearing           --Meribeth LILLETTE Daniel, MD

## 2022-10-09 NOTE — Patient Instructions (Addendum)
 [facility]  Ph: [phone]    [facility]  [address]  Phone: [phone]

## 2023-02-07 ENCOUNTER — Encounter

## 2023-02-07 ENCOUNTER — Inpatient Hospital Stay
Admit: 2023-02-07 | Payer: PRIVATE HEALTH INSURANCE | Primary: Student in an Organized Health Care Education/Training Program

## 2023-02-07 DIAGNOSIS — E1069 Type 1 diabetes mellitus with other specified complication: Secondary | ICD-10-CM

## 2023-02-07 LAB — CBC WITH AUTO DIFFERENTIAL
Basophils %: 0.2 % (ref 0.0–2.0)
Basophils Absolute: 0.02 10*3/uL (ref 0.00–0.10)
Eosinophils %: 1.3 % (ref 0.0–5.0)
Eosinophils Absolute: 0.14 10*3/uL (ref 0.00–0.40)
Hematocrit: 40 % (ref 35.0–45.0)
Hemoglobin: 13 g/dL (ref 12.0–16.0)
Immature Granulocytes %: 0.4 % (ref 0.0–0.5)
Immature Granulocytes Absolute: 0.04 10*3/uL (ref 0.00–0.04)
Lymphocytes %: 26.1 % (ref 21.0–52.0)
Lymphocytes Absolute: 2.92 10*3/uL (ref 0.90–3.30)
MCH: 29.4 pg (ref 24.0–34.0)
MCHC: 32.5 g/dL (ref 31.0–37.0)
MCV: 90.5 fL (ref 78.0–100.0)
MPV: 9.4 fL (ref 9.2–11.8)
Monocytes %: 6.4 % (ref 3.0–10.0)
Monocytes Absolute: 0.72 10*3/uL (ref 0.05–1.20)
Neutrophils %: 65.6 % (ref 40.0–73.0)
Neutrophils Absolute: 7.33 10*3/uL (ref 1.80–8.00)
Nucleated RBCs: 0 /100{WBCs}
Platelets: 390 10*3/uL (ref 135–420)
RBC: 4.42 M/uL (ref 4.20–5.30)
RDW: 12.9 % (ref 11.6–14.5)
WBC: 11.2 10*3/uL (ref 4.6–13.2)
nRBC: 0 10*3/uL (ref 0.00–0.01)

## 2023-02-07 LAB — COMPREHENSIVE METABOLIC PANEL
ALT: 16 U/L (ref 13–56)
AST: 9 U/L — ABNORMAL LOW (ref 10–38)
Albumin/Globulin Ratio: 1.2 (ref 0.8–1.7)
Albumin: 3.6 g/dL (ref 3.4–5.0)
Alk Phosphatase: 72 U/L (ref 45–117)
Anion Gap: 1 mmol/L — ABNORMAL LOW (ref 3.0–18)
BUN/Creatinine Ratio: 10 — ABNORMAL LOW (ref 12–20)
BUN: 9 mg/dL (ref 7.0–18)
CO2: 33 mmol/L — ABNORMAL HIGH (ref 21–32)
Calcium: 9.4 mg/dL (ref 8.5–10.1)
Chloride: 110 mmol/L (ref 100–111)
Creatinine: 0.86 mg/dL (ref 0.6–1.3)
Est, Glom Filt Rate: 89 mL/min/{1.73_m2} (ref >60)
Globulin: 3.1 g/dL (ref 2.0–4.0)
Glucose: 120 mg/dL — ABNORMAL HIGH (ref 74–99)
Potassium: 4.2 mmol/L (ref 3.5–5.5)
Sodium: 144 mmol/L (ref 136–145)
Total Bilirubin: 0.3 mg/dL (ref 0.2–1.0)
Total Protein: 6.7 g/dL (ref 6.4–8.2)

## 2023-02-08 LAB — LIPID PANEL
Chol/HDL Ratio: 3.5 (ref 0–5.0)
Cholesterol, Total: 156 mg/dL (ref <200)
HDL: 45 mg/dL (ref 40–60)
LDL Cholesterol: 89 mg/dL (ref 0–100)
Triglycerides: 110 mg/dL (ref <150)
VLDL Cholesterol Calculated: 22 mg/dL

## 2023-02-08 LAB — VITAMIN D 25 HYDROXY: Vit D, 25-Hydroxy: 60.3 ng/mL (ref 30–100)

## 2023-02-08 LAB — HEMOGLOBIN A1C
Estimated Avg Glucose: 146 mg/dL
Hemoglobin A1C: 6.7 % — ABNORMAL HIGH (ref 4.2–5.6)

## 2023-02-08 LAB — FAX TO: Fax to Number: 17573941032

## 2023-02-08 LAB — VITAMIN B12: Vitamin B-12: 1042 pg/mL — ABNORMAL HIGH (ref 211–911)

## 2023-03-19 ENCOUNTER — Inpatient Hospital Stay
Admit: 2023-03-19 | Discharge: 2023-03-20 | Disposition: A | Discharge status: Home / Self Care | Payer: PRIVATE HEALTH INSURANCE

## 2023-03-19 ENCOUNTER — Emergency Department
Admit: 2023-03-19 | Payer: PRIVATE HEALTH INSURANCE | Primary: Student in an Organized Health Care Education/Training Program

## 2023-03-19 DIAGNOSIS — K529 Noninfective gastroenteritis and colitis, unspecified: Secondary | ICD-10-CM

## 2023-03-19 LAB — CBC WITH AUTO DIFFERENTIAL
Basophils %: 0.3 % (ref 0.0–2.0)
Basophils Absolute: 0.05 10*3/uL (ref 0.00–0.10)
Eosinophils %: 0.9 % (ref 0.0–5.0)
Eosinophils Absolute: 0.13 10*3/uL (ref 0.00–0.40)
Hematocrit: 43.1 % (ref 35.0–45.0)
Hemoglobin: 14.8 g/dL (ref 12.0–16.0)
Immature Granulocytes %: 0.5 % (ref 0.0–0.5)
Lymphocytes %: 22.2 % (ref 21.0–52.0)
Lymphocytes Absolute: 3.35 10*3/uL — ABNORMAL HIGH (ref 0.90–3.30)
MCH: 29.3 pg (ref 24.0–34.0)
MCHC: 34.3 g/dL (ref 31.0–37.0)
MCV: 85.3 FL (ref 78.0–100.0)
MPV: 10.3 FL (ref 9.2–11.8)
Monocytes %: 4.1 % (ref 3.0–10.0)
Monocytes Absolute: 0.62 10*3/uL (ref 0.05–1.20)
Neutrophils %: 72 % (ref 40.0–73.0)
Neutrophils Absolute: 10.88 10*3/uL — ABNORMAL HIGH (ref 1.80–8.00)
Nucleated RBCs: 0 /100{WBCs}
Platelets: 353 10*3/uL (ref 135–420)
RBC: 5.05 M/uL (ref 4.20–5.30)
RDW: 12.7 % (ref 11.6–14.5)
WBC: 15.1 10*3/uL — ABNORMAL HIGH (ref 4.6–13.2)
nRBC: 0 10*3/uL (ref 0.00–0.01)

## 2023-03-19 LAB — TROPONIN: Troponin, High Sensitivity: <3 ng/L (ref 0–54)

## 2023-03-19 LAB — COMPREHENSIVE METABOLIC PANEL
ALT: 28 U/L (ref 13–56)
AST: 14 U/L (ref 10–38)
Albumin/Globulin Ratio: 1.2 (ref 0.8–1.7)
Albumin: 3.8 g/dL (ref 3.4–5.0)
Alk Phosphatase: 87 U/L (ref 45–117)
Anion Gap: 7 mmol/L (ref 3.0–18)
BUN/Creatinine Ratio: 8 — ABNORMAL LOW (ref 12–20)
BUN: 7 mg/dL (ref 7.0–18)
CO2: 30 mmol/L (ref 21–32)
Calcium: 9 mg/dL (ref 8.5–10.1)
Chloride: 99 mmol/L — ABNORMAL LOW (ref 100–111)
Creatinine: 0.87 mg/dL (ref 0.6–1.3)
Est, Glom Filt Rate: 88 mL/min/{1.73_m2} (ref >60)
Globulin: 3.3 g/dL (ref 2.0–4.0)
Glucose: 366 mg/dL — ABNORMAL HIGH (ref 74–99)
Potassium: 3.8 mmol/L (ref 3.5–5.5)
Sodium: 136 mmol/L (ref 136–145)
Total Bilirubin: 0.4 mg/dL (ref 0.2–1.0)
Total Protein: 7.1 g/dL (ref 6.4–8.2)

## 2023-03-19 LAB — URINALYSIS
Bilirubin, Urine: NEGATIVE
Blood, Urine: NEGATIVE
Glucose, Ur: >1000 mg/dL — AB
Ketones, Urine: NEGATIVE mg/dL
Leukocyte Esterase, Urine: NEGATIVE
Nitrite, Urine: NEGATIVE
Protein, UA: NEGATIVE mg/dL
Urobilinogen, Urine: 0.2 EU/dL (ref 0.2–1.0)
pH, Urine: 5.5 (ref 5.0–8.0)

## 2023-03-19 LAB — LIPASE: Lipase: 23 U/L (ref 13–75)

## 2023-03-19 LAB — HCG, SERUM, QUALITATIVE: Preg, Serum: NEGATIVE

## 2023-03-19 MED ORDER — MORPHINE SULFATE (PF) 2 MG/ML IV SOLN
2 | INTRAVENOUS | Status: AC
Start: 2023-03-19 — End: 2023-03-19
  Administered 2023-03-19: 22:00:00 2 mg via INTRAVENOUS

## 2023-03-19 MED ORDER — IOPAMIDOL 61 % IV SOLN
61 | Freq: Once | INTRAVENOUS | Status: AC | PRN
Start: 2023-03-19 — End: 2023-03-19
  Administered 2023-03-19: 23:00:00 100 mL via INTRAVENOUS

## 2023-03-19 MED ORDER — KETOROLAC TROMETHAMINE 30 MG/ML IJ SOLN
30 | INTRAMUSCULAR | Status: AC
Start: 2023-03-19 — End: 2023-03-19
  Administered 2023-03-19: 22:00:00 30 mg via INTRAVENOUS

## 2023-03-19 MED ORDER — ONDANSETRON HCL 4 MG/2ML IJ SOLN
4 | Freq: Once | INTRAMUSCULAR | Status: AC
Start: 2023-03-19 — End: 2023-03-19
  Administered 2023-03-19: 22:00:00 4 mg via INTRAVENOUS

## 2023-03-19 MED FILL — KETOROLAC TROMETHAMINE 30 MG/ML IJ SOLN: 30 MG/ML | INTRAMUSCULAR | Qty: 1 | Fill #0

## 2023-03-19 MED FILL — ONDANSETRON HCL 4 MG/2ML IJ SOLN: 4 MG/2ML | INTRAMUSCULAR | Qty: 2 | Fill #0

## 2023-03-19 MED FILL — ISOVUE-300 61 % IV SOLN: 61 % | INTRAVENOUS | Qty: 100 | Fill #0

## 2023-03-19 MED FILL — MORPHINE SULFATE 2 MG/ML IJ SOLN: 2 mg/mL | INTRAMUSCULAR | Qty: 1 | Fill #0

## 2023-03-19 NOTE — ED Triage Notes (Addendum)
 Pt ambulated to triage. C/C lumbar back pain and lower abdominal pain for 1 week, but it is worse today. Pt reports nausea. 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     Denies urinary symptoms.

## 2023-03-19 NOTE — ED Provider Notes (Signed)
 [facility] EMERGENCY DEPARTMENT  EMERGENCY DEPARTMENT ENCOUNTER         Pt Name: Tara Browning  MRN: [id number]  Birthdate [birth year]  Date of evaluation: 03/19/2023  Provider: Elesa Hacker, PA-C   PCP: Burley Saver, MD  Note Started: 8:30 PM EDT 03/19/23     CHIEF COMPLAINT       Chief Complaint   Patient presents with    Back Pain    Abdominal Pain        HISTORY OF PRESENT ILLNESS: 1 or more elements      History From: Patient  HPI Limitations: None     Tara Browning is a 38 y.o. female who presents with lumbar back pain and lower abdominal pain that been ongoing for about a week today.  Patient endorses nausea without any vomiting.  Patient denies any chance of pregnancy.  She denies any urinary symptoms.  She has had kidney stones in the past but states this feels different.  She also endorses some diarrhea.  Patient recently diagnosed with type 1 diabetes, states she has not taken her insulin yet today.     Nursing Notes were all reviewed and agreed with or any disagreements were addressed in the HPI.  Please see MDM for additional details of HPI and ROS     REVIEW OF SYSTEMS      Review of Systems   Gastrointestinal:  Positive for abdominal pain, diarrhea and nausea. Negative for vomiting.   All other systems reviewed and are negative.       Positives and Pertinent negatives as per HPI.    PAST HISTORY     Past Medical History:  No past medical history on file.    Past Surgical History:  No past surgical history on file.    Family History:  No family history on file.    Social History:  Social History     Tobacco Use    Smoking status: Never    Smokeless tobacco: Never   Substance Use Topics    Alcohol use: Yes    Drug use: Never       Allergies:  Allergies   Allergen Reactions    Penicillins Anaphylaxis       CURRENT MEDICATIONS      Previous Medications    AZELASTINE (ASTELIN) 0.1 % NASAL SPRAY    2 sprays by Nasal route 2 times daily Use in each nostril as directed    INSULIN  REGULAR HUMAN (HUMULIN R U-500 KWIKPEN) 500 UNIT/ML SOPN CONCENTRATED INJECTION PEN    Take 60 units daily when fasting BG <275. Take 70 units daily when fasting BG >275    LEVOCETIRIZINE (XYZAL) 5 MG TABLET    Take 1 tablet by mouth nightly    LINACLOTIDE (LINZESS) 72 MCG CAPS CAPSULE    Take 1 capsule by mouth every morning (before breakfast)    SENNA-DOCUSATE (PERICOLACE) 8.6-50 MG PER TABLET    Take 1 tablet by mouth daily       PHYSICAL EXAM      ED Triage Vitals [03/19/23 1654]   Encounter Vitals Group      BP 130/85      Systolic BP Percentile       Diastolic BP Percentile       Pulse 81      Respirations 16      Temp 97.3 F (36.3 C)      Temp Source Temporal      SpO2 98 %  Weight - Scale 102.1 kg (225 lb)      Height 1.651 m (5\' 5" )      Head Circumference       Peak Flow       Pain Score       Pain Loc       Pain Education       Exclude from Growth Chart         Physical Exam  Constitutional:       Appearance: Normal appearance.   HENT:      Head: Normocephalic and atraumatic.   Cardiovascular:      Rate and Rhythm: Normal rate and regular rhythm.      Pulses: Normal pulses.      Heart sounds: Normal heart sounds.   Pulmonary:      Effort: Pulmonary effort is normal.      Breath sounds: Normal breath sounds.   Abdominal:      General: Abdomen is flat.      Palpations: Abdomen is soft.      Tenderness: There is generalized abdominal tenderness. There is no right CVA tenderness or left CVA tenderness.   Musculoskeletal:         General: Normal range of motion.      Cervical back: Normal range of motion.   Skin:     General: Skin is warm and dry.      Capillary Refill: Capillary refill takes less than 2 seconds.   Neurological:      General: No focal deficit present.      Mental Status: She is alert and oriented to person, place, and time. Mental status is at baseline.   Psychiatric:         Mood and Affect: Mood normal.         Behavior: Behavior normal.         Thought Content: Thought content normal.          Judgment: Judgment normal.          DIAGNOSTIC RESULTS   LABS:     Recent Results (from the past 24 hours)   CBC with Auto Differential    Collection Time: 03/19/23  5:31 PM   Result Value Ref Range    WBC 15.1 (H) 4.6 - 13.2 K/uL    RBC 5.05 4.20 - 5.30 M/uL    Hemoglobin 14.8 12.0 - 16.0 g/dL    Hematocrit 16.1 09.6 - 45.0 %    MCV 85.3 78.0 - 100.0 FL    MCH 29.3 24.0 - 34.0 PG    MCHC 34.3 31.0 - 37.0 g/dL    RDW 04.5 40.9 - 81.1 %    Platelets 353 135 - 420 K/uL    MPV 10.3 9.2 - 11.8 FL    Nucleated RBCs 0.0 0 PER 100 WBC    nRBC 0.00 0.00 - 0.01 K/uL    Neutrophils % 72.0 40.0 - 73.0 %    Lymphocytes % 22.2 21.0 - 52.0 %    Monocytes % 4.1 3.0 - 10.0 %    Eosinophils % 0.9 0.0 - 5.0 %    Basophils % 0.3 0.0 - 2.0 %    Immature Granulocytes % 0.5 0.0 - 0.5 %    Neutrophils Absolute 10.88 (H) 1.80 - 8.00 K/UL    Lymphocytes Absolute 3.35 (H) 0.90 - 3.30 K/UL    Monocytes Absolute 0.62 0.05 - 1.20 K/UL    Eosinophils Absolute 0.13 0.00 - 0.40 K/UL  Basophils Absolute 0.05 0.00 - 0.10 K/UL    Immature Granulocytes Absolute 0.07 (H) 0.00 - 0.04 K/UL    Differential Type AUTOMATED     CMP    Collection Time: 03/19/23  5:31 PM   Result Value Ref Range    Sodium 136 136 - 145 mmol/L    Potassium 3.8 3.5 - 5.5 mmol/L    Chloride 99 (L) 100 - 111 mmol/L    CO2 30 21 - 32 mmol/L    Anion Gap 7 3.0 - 18 mmol/L    Glucose 366 (H) 74 - 99 mg/dL    BUN 7 7.0 - 18 MG/DL    Creatinine 9.14 0.6 - 1.3 MG/DL    BUN/Creatinine Ratio 8 (L) 12 - 20      Est, Glom Filt Rate 88 >60 ml/min/1.92m2    Calcium 9.0 8.5 - 10.1 MG/DL    Total Bilirubin 0.4 0.2 - 1.0 MG/DL    ALT 28 13 - 56 U/L    AST 14 10 - 38 U/L    Alk Phosphatase 87 45 - 117 U/L    Total Protein 7.1 6.4 - 8.2 g/dL    Albumin 3.8 3.4 - 5.0 g/dL    Globulin 3.3 2.0 - 4.0 g/dL    Albumin/Globulin Ratio 1.2 0.8 - 1.7     Lipase    Collection Time: 03/19/23  5:31 PM   Result Value Ref Range    Lipase 23 13 - 75 U/L   HCG Qualitative, Serum    Collection Time:  03/19/23  5:31 PM   Result Value Ref Range    Preg, Serum Negative NEG     Troponin    Collection Time: 03/19/23  5:31 PM   Result Value Ref Range    Troponin, High Sensitivity <3 0 - 54 ng/L   Urinalysis    Collection Time: 03/19/23  6:08 PM   Result Value Ref Range    Color, UA YELLOW      Appearance CLEAR      Specific Gravity, UA 1.027 1.005 - 1.030      pH, Urine 5.5 5.0 - 8.0      Protein, UA Negative NEG mg/dL    Glucose, Ur >7829 (A) NEG mg/dL    Ketones, Urine Negative NEG mg/dL    Bilirubin, Urine Negative NEG      Blood, Urine Negative NEG      Urobilinogen, Urine 0.2 0.2 - 1.0 EU/dL    Nitrite, Urine Negative NEG      Leukocyte Esterase, Urine Negative NEG         RADIOLOGY:  Non-plain film images such as CT, Ultrasound and MRI are read by the radiologist. Plain radiographic images are visualized and preliminarily interpreted by myself with the below findings:          Interpretation per the Radiologist below, if available at the time of this note:     CT ABDOMEN PELVIS W IV CONTRAST Additional Contrast? None  Result Date: 03/19/2023  EXAM: CT ABDOMEN PELVIS W IV CONTRAST INDICATION: ab and flank pain, white count COMPARISON: None CONTRAST: 100 mL of Isovue-370. ORAL CONTRAST: None TECHNIQUE: Following intravenous administration of contrast, thin axial images were obtained through the abdomen and pelvis. Coronal and sagittal reconstructions were generated. CT dose reduction was achieved through use of a standardized protocol tailored for this examination and automatic exposure control for dose modulation. FINDINGS: LOWER THORAX: No significant abnormality in the incidentally imaged lower chest. LIVER: No  mass. BILIARY TREE: Cholecystectomy. CBD is not dilated. SPLEEN: within normal limits. PANCREAS: No mass or ductal dilatation. ADRENALS: Unremarkable. KIDNEYS: No mass, calculus, or hydronephrosis. STOMACH: Unremarkable. SMALL BOWEL: No dilatation or wall thickening. COLON: Wall thickening of colon,  predominantly involving transverse colon and to a lesser extent right and left colon. APPENDIX: Not seen. PERITONEUM: No ascites or pneumoperitoneum. RETROPERITONEUM: No lymphadenopathy or aortic aneurysm. REPRODUCTIVE ORGANS: Unremarkable. URINARY BLADDER: No mass or calculus. BONES: No destructive bone lesion. ABDOMINAL WALL: No mass or hernia. ADDITIONAL COMMENTS: N/A     Pancolitis. Electronically signed by Emeline Darling       PROCEDURES   Unless otherwise noted below, none  Procedures       EMERGENCY DEPARTMENT COURSE and DIFFERENTIAL DIAGNOSIS/MDM   Vitals:    Vitals:    03/19/23 1654   BP: 130/85   Pulse: 81   Resp: 16   Temp: 97.3 F (36.3 C)   TempSrc: Temporal   SpO2: 98%   Weight: 102.1 kg (225 lb)   Height: 1.651 m (5\' 5" )        Patient was given the following medications:  Medications   sodium chloride 0.9 % bolus 1,000 mL (has no administration in time range)   ondansetron (ZOFRAN) injection 4 mg (4 mg IntraVENous Given 03/19/23 1805)   ketorolac (TORADOL) injection 30 mg (30 mg IntraVENous Given 03/19/23 1805)   morphine (PF) injection 2 mg (2 mg IntraVENous Given 03/19/23 1805)   iopamidol (ISOVUE-300) 61 % injection 100 mL (100 mLs IntraVENous Given 03/19/23 1840)       CONSULTS: (Who and What was discussed)  None    Chronic Conditions: No past medical history on file.      Social Determinants affecting Dx or Tx: None    Records Reviewed (source and summary of external records): Nursing Notes, Old Medical Records, Previous Radiology Studies, and Previous Laboratory Studies    MDM: CC/HPI Summary, DDx, ED Course, and Reassessment, Disposition Considerations (Tests not done, Shared Decision Making, Pt Expectation of Test or Tx.): CT scan indicates pancolitis.  Patient states that in the past she has been told she may have celiac's or IBD but is never been told that she has UC or Crohn's.  Patient states that she does not want to stay in the emergency room any longer and she would like to follow-up  outpatient with GI.  Will send patient home with medications to alleviate her symptoms and referral to GI.  I encouraged her to return to the emergency room if her symptoms worsen.  Patient verbalized her understanding will follow-up accordingly.             FINAL IMPRESSION     1. Generalized abdominal pain    2. Nausea    3. Pancolitis [facility])          DISPOSITION/PLAN   DISPOSITION Decision To Discharge 03/19/2023 08:32:35 PM   DISPOSITION CONDITION Stable             Care plan outlined and precautions discussed.  Patient has no new complaints, changes, or physical findings.  Results of labs and CT were reviewed with the patient. All medications were reviewed with the patient; will d/c home with Roxicodone, Zofran, prednisone. All of pt's questions and concerns were addressed. Patient was instructed and agrees to follow up with GI, as well as to return to the ED upon further deterioration. Patient is ready to go home.      PATIENT REFERRED TO:  [place]ems, [place]  [id number]  [address] [place] [address]  [phone]             DISCHARGE MEDICATIONS:     Medication List        START taking these medications      ondansetron 4 MG disintegrating tablet  Commonly known as: ZOFRAN-ODT  Take 1 tablet by mouth 3 times daily as needed for Nausea or Vomiting     oxyCODONE 5 MG immediate release tablet  Commonly known as: Roxicodone  Take 1 tablet by mouth every 6 hours as needed for Pain for up to 3 days. Intended supply: 3 days. Take lowest dose possible to manage pain Max Daily Amount: 20 mg     predniSONE 20 MG tablet  Commonly known as: DELTASONE  Take 1 tablet by mouth daily for 5 days            ASK your doctor about these medications      azelastine 0.1 % nasal spray  Commonly known as: ASTELIN  2 sprays by Nasal route 2 times daily Use in each nostril as directed     HumuLIN R U-500 KwikPen 500 UNIT/ML Sopn concentrated injection pen  Generic drug: insulin regular human  Take 60 units  daily when fasting BG <275. Take 70 units daily when fasting BG >275     levocetirizine 5 MG tablet  Commonly known as: XYZAL  Take 1 tablet by mouth nightly     linaCLOtide 72 MCG Caps capsule  Commonly known as: LINZESS  Take 1 capsule by mouth every morning (before breakfast)     senna-docusate 8.6-50 MG per tablet  Commonly known as: PERICOLACE  Take 1 tablet by mouth daily               Where to Get Your Medications        These medications were sent to [facility] [address] [place], [place] - [address] [address] [phone] Carmon Ginsberg [phone]  [address] [place] [address]      Phone: [phone]   ondansetron 4 MG disintegrating tablet  oxyCODONE 5 MG immediate release tablet  predniSONE 20 MG tablet           DISCONTINUED MEDICATIONS:  Current Discharge Medication List          I have seen and evaluated the patient autonomously. My supervision physician was on site and available for consultation if needed.     I am the Primary Clinician of Record.   Elesa Hacker, PA-C (electronically signed)    (Please note that parts of this dictation were completed with voice recognition software. Quite often unanticipated grammatical, syntax, homophones, and other interpretive errors are inadvertently transcribed by the computer software. Please disregards these errors. Please excuse any errors that have escaped final proofreading.)     Elesa Hacker, PA-C  03/19/23 2035

## 2023-03-20 MED ORDER — PREDNISONE 20 MG PO TABS
20 | ORAL_TABLET | Freq: Every day | ORAL | 0 refills | Status: AC
Start: 2023-03-20 — End: 2023-03-24

## 2023-03-20 MED ORDER — OXYCODONE HCL 5 MG PO TABS
5 | ORAL_TABLET | Freq: Four times a day (QID) | ORAL | 0 refills | Status: AC | PRN
Start: 2023-03-20 — End: 2023-03-22

## 2023-03-20 MED ORDER — ONDANSETRON 4 MG PO TBDP
4 | ORAL_TABLET | Freq: Three times a day (TID) | ORAL | 0 refills | Status: AC | PRN
Start: 2023-03-20 — End: ?

## 2023-03-20 MED ORDER — SODIUM CHLORIDE 0.9 % IV BOLUS
0.9 | Freq: Once | INTRAVENOUS | Status: DC
Start: 2023-03-20 — End: 2023-03-19

## 2023-03-20 MED FILL — SODIUM CHLORIDE 0.9 % IV SOLN: 0.9 % | INTRAVENOUS | Qty: 1000 | Fill #0

## 2023-08-21 ENCOUNTER — Inpatient Hospital Stay
Admit: 2023-08-21 | Discharge: 2023-08-22 | Disposition: A | Discharge status: Home / Self Care | Payer: PRIVATE HEALTH INSURANCE | Arrived: VH

## 2023-08-21 ENCOUNTER — Emergency Department
Admit: 2023-08-21 | Payer: PRIVATE HEALTH INSURANCE | Primary: Student in an Organized Health Care Education/Training Program

## 2023-08-21 DIAGNOSIS — R197 Diarrhea, unspecified: Principal | ICD-10-CM

## 2023-08-21 LAB — LIPASE: Lipase: 14 U/L (ref 13–75)

## 2023-08-21 LAB — CBC WITH AUTO DIFFERENTIAL
Basophils %: 0.3 % (ref 0.0–2.0)
Basophils Absolute: 0.03 K/UL (ref 0.00–0.10)
Eosinophils %: 1.2 % (ref 0.0–5.0)
Eosinophils Absolute: 0.13 K/UL (ref 0.00–0.40)
Hematocrit: 42 % (ref 35.0–45.0)
Hemoglobin: 13.9 g/dL (ref 12.0–16.0)
Immature Granulocytes %: 0.3 % (ref 0.0–0.5)
Immature Granulocytes Absolute: 0.03 K/UL (ref 0.00–0.04)
Lymphocytes %: 29.2 % (ref 21.0–52.0)
Lymphocytes Absolute: 3.14 K/UL (ref 0.90–3.30)
MCH: 28.8 pg (ref 24.0–34.0)
MCHC: 33.1 g/dL (ref 31.0–37.0)
MCV: 87 FL (ref 78.0–100.0)
MPV: 10 FL (ref 9.2–11.8)
Monocytes %: 4.7 % (ref 3.0–10.0)
Monocytes Absolute: 0.5 K/UL (ref 0.05–1.20)
Neutrophils %: 64.3 % (ref 40.0–73.0)
Neutrophils Absolute: 6.92 K/UL (ref 1.80–8.00)
Nucleated RBCs: 0 /100{WBCs}
Platelets: 333 K/uL (ref 135–420)
RBC: 4.83 M/uL (ref 4.20–5.30)
RDW: 12.8 % (ref 11.6–14.5)
WBC: 10.8 K/uL (ref 4.6–13.2)
nRBC: 0 K/uL (ref 0.00–0.01)

## 2023-08-21 LAB — COMPREHENSIVE METABOLIC PANEL
ALT: 37 U/L — ABNORMAL HIGH (ref 10–35)
AST: 23 U/L (ref 10–38)
Albumin/Globulin Ratio: 1.4 (ref 0.8–1.7)
Albumin: 4.1 g/dL (ref 3.4–5.0)
Alk Phosphatase: 82 U/L (ref 45–117)
Anion Gap: 13 mmol/L (ref 3–18)
BUN/Creatinine Ratio: 6 — ABNORMAL LOW (ref 12–20)
BUN: 4 mg/dL — ABNORMAL LOW (ref 6–23)
CO2: 27 mmol/L (ref 21–32)
Calcium: 9.4 mg/dL (ref 8.5–10.1)
Chloride: 101 mmol/L (ref 98–107)
Creatinine: 0.65 mg/dL (ref 0.60–1.30)
Est, Glom Filt Rate: >90 ml/min/1.73m2 (ref >60)
Globulin: 3 g/dL (ref 2.0–4.0)
Glucose: 232 mg/dL — ABNORMAL HIGH (ref 74–108)
Potassium: 3.7 mmol/L (ref 3.5–5.5)
Sodium: 141 mmol/L (ref 136–145)
Total Bilirubin: 0.4 mg/dL (ref 0.2–1.0)
Total Protein: 7 g/dL (ref 6.4–8.2)

## 2023-08-21 LAB — URINALYSIS
Bilirubin, Urine: NEGATIVE
Blood, Urine: NEGATIVE
Glucose, Ur: >1000 mg/dL — AB
Ketones, Urine: NEGATIVE mg/dL
Leukocyte Esterase, Urine: NEGATIVE
Nitrite, Urine: NEGATIVE
Protein, UA: NEGATIVE mg/dL
Specific Gravity, UA: 1.011 (ref 1.005–1.030)
Urobilinogen, Urine: 0.2 EU/dL (ref 0.2–1.0)
pH, Urine: 5.5 (ref 5.0–8.0)

## 2023-08-21 LAB — PREGNANCY, URINE: Pregnancy, Urine: NEGATIVE

## 2023-08-21 MED ORDER — IOHEXOL 350 MG/ML IV SOLN
350 | Freq: Once | INTRAVENOUS | Status: DC | PRN
Start: 2023-08-21 — End: 2023-08-21

## 2023-08-21 MED ORDER — IOPAMIDOL 61 % IV SOLN
61 | Freq: Once | INTRAVENOUS | Status: AC | PRN
Start: 2023-08-21 — End: 2023-08-21
  Administered 2023-08-21: 23:00:00 100 mL via INTRAVENOUS

## 2023-08-21 MED ORDER — SODIUM CHLORIDE 0.9 % IV BOLUS
0.9 | Freq: Once | INTRAVENOUS | Status: AC
Start: 2023-08-21 — End: 2023-08-21
  Administered 2023-08-21: 22:00:00 1000 mL via INTRAVENOUS

## 2023-08-21 MED ORDER — ONDANSETRON HCL 4 MG/2ML IJ SOLN
4 | INTRAMUSCULAR | Status: AC
Start: 2023-08-21 — End: 2023-08-21
  Administered 2023-08-21: 22:00:00 4 mg via INTRAVENOUS

## 2023-08-21 MED ORDER — PANTOPRAZOLE SODIUM 40 MG IV SOLR
40 | INTRAVENOUS | Status: AC
Start: 2023-08-21 — End: 2023-08-21
  Administered 2023-08-21: 22:00:00 40 mg via INTRAVENOUS

## 2023-08-21 MED FILL — ONDANSETRON HCL 4 MG/2ML IJ SOLN: 4 MG/2ML | INTRAMUSCULAR | Qty: 2 | Fill #0

## 2023-08-21 MED FILL — OMNIPAQUE 350 MG/ML IV SOLN: 350 mg/mL | INTRAVENOUS | Qty: 100 | Fill #0

## 2023-08-21 MED FILL — ISOVUE-300 61 % IV SOLN: 61 % | INTRAVENOUS | Qty: 100 | Fill #0

## 2023-08-21 MED FILL — SODIUM CHLORIDE 0.9 % IV SOLN: 0.9 % | INTRAVENOUS | Qty: 1000 | Fill #0

## 2023-08-21 MED FILL — PROTONIX 40 MG IV SOLR: 40 mg | INTRAVENOUS | Qty: 40 | Fill #0

## 2023-08-21 NOTE — ED Triage Notes (Signed)
 Patient reports she is vomiting blood and having diarrhea for three days. Reports she is having abdominal pain on right side.

## 2023-08-21 NOTE — ED Provider Notes (Cosign Needed)
 [facility] EMERGENCY DEPARTMENT  EMERGENCY DEPARTMENT ENCOUNTER       Pt Name: Tara Browning  MRN: [id number]  Birthdate [birth year]  Date of evaluation: 08/21/2023  PCP: Ninfa Meribeth FERNS, MD  Note Started: 8:27 PM 08/21/23     CHIEF COMPLAINT       Chief Complaint   Patient presents with    Abdominal Pain    Hematemesis        HISTORY OF PRESENT ILLNESS: 1 or more elements      History From: Patient  HPI Limitations: None  Chronic Conditions: DM  Social Determinants affecting Dx or Tx: none      Tara Browning is a 38 y.o. female who presents to ED c/o right sided abdominal pain with nausea vomiting which began 2 days ago.  She had a few episodes of vomiting bright red blood yesterday.  Early this morning she started having several episodes of watery diarrhea.  No bloody or black stools.  She denies fever body aches.  Had colitis in March of this year for which she was seen here and then followed up with GI, they attempted colonoscopy but had poor bowel prep and it had to be aborted and was never rescheduled.  She states she had colonoscopy and endoscopy around 2015 or 2016 and states she has had stomach problems on and off for many years she denies any known aggravating foods except for possibly dairy or salads.  Past surgical history of cholecystectomy.  No recent travel or recent antibiotic use     Nursing Notes were all reviewed and agreed with or any disagreements were addressed in the HPI.    PAST HISTORY     Past Medical History:  Past Medical History:   Diagnosis Date    Colitis     Menopause     Pancolitis (HCC)        Past Surgical History:  No past surgical history on file.    Family History:  No family history on file.    Social History:  Social History     Socioeconomic History    Marital status: Single   Tobacco Use    Smoking status: Never    Smokeless tobacco: Never   Substance and Sexual Activity    Alcohol use: Not Currently    Drug use: Never     Social Drivers of Health      Financial Resource Strain: Low Risk  (08/15/2022)    Overall Financial Resource Strain (CARDIA)     Difficulty of Paying Living Expenses: Not hard at all   Transportation Needs: Unknown (08/15/2022)    PRAPARE - Transportation     Lack of Transportation (Non-Medical): No   Intimate Partner Violence: Not At Risk (02/16/2022)    Received from [facility]    Humiliation, Afraid, Rape, and Kick questionnaire     Within the last year, have you been afraid of your partner or ex-partner?: No     Within the last year, have you been humiliated or emotionally abused in other ways by your partner or ex-partner?: No     Within the last year, have you been kicked, hit, slapped, or otherwise physically hurt by your partner or ex-partner?: No     Within the last year, have you been raped or forced to have any kind of sexual activity by your partner or ex-partner?: No   Housing Stability: Unknown (08/15/2022)    Housing Stability Vital Sign     Unstable  Housing in the Last Year: No       Allergies:  Allergies   Allergen Reactions    Penicillins Anaphylaxis       CURRENT MEDICATIONS      Current Facility-Administered Medications   Medication Dose Route Frequency Provider Last Rate Last Admin    promethazine (PHENERGAN) 12.5mg  in sodium chloride  0.9% 50 mL IVPB SOLN 12.5 mg  12.5 mg IntraVENous NOW Krista Godsil J, PA        ketorolac  (TORADOL ) injection 15 mg  15 mg IntraVENous NOW Glenroy Crossen J, PA         Current Outpatient Medications   Medication Sig Dispense Refill    ondansetron  (ZOFRAN -ODT) 4 MG disintegrating tablet Take 1 tablet by mouth 3 times daily as needed for Nausea or Vomiting 10 tablet 0    omeprazole (PRILOSEC) 20 MG delayed release capsule Take 1 capsule by mouth every morning (before breakfast) 30 capsule 0    insulin  regular human (HUMULIN  R U-500 KWIKPEN) 500 UNIT/ML SOPN concentrated injection pen Take 60 units daily when fasting BG <275. Take 70 units daily when fasting BG >275 2 each 5    senna-docusate  (PERICOLACE) 8.6-50 MG per tablet Take 1 tablet by mouth daily 30 tablet 3    linaCLOtide  (LINZESS ) 72 MCG CAPS capsule Take 1 capsule by mouth every morning (before breakfast) 30 capsule 0    levocetirizine (XYZAL ) 5 MG tablet Take 1 tablet by mouth nightly 30 tablet 1    azelastine  (ASTELIN ) 0.1 % nasal spray 2 sprays by Nasal route 2 times daily Use in each nostril as directed 120 mL 1          PHYSICAL EXAM      Vitals:    08/21/23 1711 08/21/23 2021   BP: 117/80 113/72   Pulse: 73 76   Resp: 14 18   Temp: 97.7 F (36.5 C)    TempSrc: Oral    SpO2: 99% 100%   Weight: 95.3 kg (210 lb)    Height: 1.651 m (5' 5)      Physical Exam  Vital signs and nursing notes reviewed.    CONSTITUTIONAL: Alert. Well-appearing; well-nourished; in no apparent distress.  HEAD: Normocephalic; atraumatic.  EYES: PERRL; EOM's intact. No nystagmus. Conjunctiva clear.   ENT: TM's normal. External ear normal. Normal nose; no rhinorrhea. Normal pharynx. Tonsils not enlarged without exudate. Moist mucus membranes.  NECK: Supple; FROM without difficulty, non-tender; no cervical lymphadenopathy.             CV: Normal S1, S2; no murmurs, rubs, or gallops. No chest wall tenderness.  RESPIRATORY: Normal chest excursion with respiration; breath sounds clear and equal bilaterally; no wheezes, rhonchi, or rales.  GI: Normal bowel sounds; non-distended; non-tender; no guarding or rigidity; no palpable organomegaly. No CVA tenderness.   BACK:  No evidence of trauma or deformity. Non-tender to palpation. FROM without difficulty. Negative straight leg raise bilaterally.  EXT: Normal ROM in all four extremities; non-tender to palpation.  SKIN: Normal for age and race; warm; dry; good turgor; no apparent lesions or exudate.  NEURO: A & O x3. Cranial nerves 2-12 intact. Motor 5/5 bilaterally. Sensation intact.   PSYCH:  Mood and affect appropriate.        DIAGNOSTIC RESULTS   LABS:    Recent Results (from the past 24 hours)   CBC with Auto Differential     Collection Time: 08/21/23  5:25 PM   Result Value Ref Range  WBC 10.8 4.6 - 13.2 K/uL    RBC 4.83 4.20 - 5.30 M/uL    Hemoglobin 13.9 12.0 - 16.0 g/dL    Hematocrit 57.9 64.9 - 45.0 %    MCV 87.0 78.0 - 100.0 FL    MCH 28.8 24.0 - 34.0 PG    MCHC 33.1 31.0 - 37.0 g/dL    RDW 87.1 88.3 - 85.4 %    Platelets 333 135 - 420 K/uL    MPV 10.0 9.2 - 11.8 FL    Nucleated RBCs 0.0 0 PER 100 WBC    nRBC 0.00 0.00 - 0.01 K/uL    Neutrophils % 64.3 40.0 - 73.0 %    Lymphocytes % 29.2 21.0 - 52.0 %    Monocytes % 4.7 3.0 - 10.0 %    Eosinophils % 1.2 0.0 - 5.0 %    Basophils % 0.3 0.0 - 2.0 %    Immature Granulocytes % 0.3 0.0 - 0.5 %    Neutrophils Absolute 6.92 1.80 - 8.00 K/UL    Lymphocytes Absolute 3.14 0.90 - 3.30 K/UL    Monocytes Absolute 0.50 0.05 - 1.20 K/UL    Eosinophils Absolute 0.13 0.00 - 0.40 K/UL    Basophils Absolute 0.03 0.00 - 0.10 K/UL    Immature Granulocytes Absolute 0.03 0.00 - 0.04 K/UL    Differential Type AUTOMATED     Comprehensive Metabolic Panel    Collection Time: 08/21/23  5:25 PM   Result Value Ref Range    Sodium 141 136 - 145 mmol/L    Potassium 3.7 3.5 - 5.5 mmol/L    Chloride 101 98 - 107 mmol/L    CO2 27 21 - 32 mmol/L    Anion Gap 13 3 - 18 mmol/L    Glucose 232 (H) 74 - 108 mg/dL    BUN 4 (L) 6 - 23 MG/DL    Creatinine 9.34 9.39 - 1.30 MG/DL    BUN/Creatinine Ratio 6 (L) 12 - 20      Est, Glom Filt Rate >90 >60 ml/min/1.57m2    Calcium 9.4 8.5 - 10.1 MG/DL    Total Bilirubin 0.4 0.2 - 1.0 MG/DL    ALT 37 (H) 10 - 35 U/L    AST 23 10 - 38 U/L    Alk Phosphatase 82 45 - 117 U/L    Total Protein 7.0 6.4 - 8.2 g/dL    Albumin 4.1 3.4 - 5.0 g/dL    Globulin 3.0 2.0 - 4.0 g/dL    Albumin/Globulin Ratio 1.4 0.8 - 1.7     Lipase    Collection Time: 08/21/23  5:25 PM   Result Value Ref Range    Lipase 14 13 - 75 U/L   Urinalysis    Collection Time: 08/21/23  5:25 PM   Result Value Ref Range    Color, UA YELLOW      Appearance CLEAR      Specific Gravity, UA 1.011 1.005 - 1.030      pH, Urine  5.5 5.0 - 8.0      Protein, UA Negative NEG mg/dL    Glucose, Ur >8999 (A) NEG mg/dL    Ketones, Urine Negative NEG mg/dL    Bilirubin, Urine Negative NEG      Blood, Urine Negative NEG      Urobilinogen, Urine 0.2 0.2 - 1.0 EU/dL    Nitrite, Urine Negative NEG      Leukocyte Esterase, Urine Negative NEG     Pregnancy, Urine  Collection Time: 08/21/23  5:25 PM   Result Value Ref Range    Pregnancy, Urine Negative NEG         Labs Reviewed   COMPREHENSIVE METABOLIC PANEL - Abnormal; Notable for the following components:       Result Value    Glucose 232 (*)     BUN 4 (*)     BUN/Creatinine Ratio 6 (*)     ALT 37 (*)     All other components within normal limits   URINALYSIS - Abnormal; Notable for the following components:    Glucose, Ur >1000 (*)     All other components within normal limits   CBC WITH AUTO DIFFERENTIAL   LIPASE   PREGNANCY, URINE           RADIOLOGY:  Non-plain film images such as CT, Ultrasound and MRI are read by the radiologist. Plain radiographic images are visualized and preliminarily interpreted by the ED Provider with the below findings:        Interpretation per the Radiologist below, if available at the time of this note:  CT ABDOMEN PELVIS W IV CONTRAST Additional Contrast? None   Final Result   No acute intra-abdominal pathology.   No bowel inflammation or obstruction.   Status post cholecystectomy.      Electronically signed by Arlyn Seen              PROCEDURES   Unless otherwise noted below, none  Procedures         CRITICAL CARE TIME   none    EMERGENCY DEPARTMENT COURSE and DIFFERENTIAL DIAGNOSIS/MDM   Vitals:    Vitals:    08/21/23 1711 08/21/23 2021   BP: 117/80 113/72   Pulse: 73 76   Resp: 14 18   Temp: 97.7 F (36.5 C)    TempSrc: Oral    SpO2: 99% 100%   Weight: 95.3 kg (210 lb)    Height: 1.651 m (5' 5)        Patient was given the following medications:  Medications   promethazine (PHENERGAN) 12.5mg  in sodium chloride  0.9% 50 mL IVPB SOLN 12.5 mg (has no  administration in time range)   ketorolac  (TORADOL ) injection 15 mg (has no administration in time range)   sodium chloride  0.9 % bolus 1,000 mL (0 mLs IntraVENous Stopped 08/21/23 2025)   ondansetron  (ZOFRAN ) injection 4 mg (4 mg IntraVENous Given 08/21/23 1800)   pantoprazole  (PROTONIX ) 40 mg in sodium chloride  (PF) 0.9 % 10 mL injection (40 mg IntraVENous Given 08/21/23 1755)   iopamidol  (ISOVUE -300) 61 % IntraVENous 100 mL (100 mLs IntraVENous Given 08/21/23 1903)           Records Reviewed (source and summary): Old medical records.  Nursing notes.    CLINICAL MANAGEMENT TOOLS:  Not Applicable      ED COURSE       Medial Decision Making:  DDX: Viral/foodborne gastroenteritis, colitis, peptic ulcer disease, appendicitis, IBD, IBS, UTI, pyelonephritis, Mallory-Weiss tear    38 y.o. female  In evaluation of the above differential diagnosis, consideration was given to the following tests and treatments: Labs are overall unremarkable specifically normal H&H.  CT abdomen pelvis shows no acute abnormalities.  She has not had any further vomiting or diarrhea or hematemesis in the ED.  She is hemodynamically stable.  I suspect that she has a viral versus foodborne gastroenteritis due to sudden onset and possibly had a small Mallory-Weiss tear that has resolved and she has  not had any black stools or further hematemesis or chest pain that would warrant further imaging or hospitalization.  Will start on Zofran , daily Prilosec, diet changes encourage plenty of fluids and advance diet slowly as tolerated.  Also provided her with a new GI recommendation for follow-up as she was unable to complete colonoscopy 4 months ago and wants a new recommendation.  I discussed each of these tests and considerations with the patient. They agree with the plan of discharge.      FINAL IMPRESSION     1. Nausea vomiting and diarrhea    2. Hematemesis with nausea            DISPOSITION/PLAN   DISPOSITION Decision To Discharge 08/21/2023 08:22:38  PM   DISPOSITION CONDITION Stable                PATIENT REFERRED TO:  Smith-Harrison, Meribeth FERNS, MD  [address]  [address]  [place] [address]  [phone]    Schedule an appointment as soon as possible for a visit       Norval Norval LABOR, MD  [address]  [address]  [place] [address]  [phone]    Schedule an appointment as soon as possible for a visit       [facility] Emergency Department  [address]  [place]  [phone]  [phone]    As needed, If symptoms worsen         DISCHARGE MEDICATIONS:     Medication List        START taking these medications      omeprazole 20 MG delayed release capsule  Commonly known as: PRILOSEC  Take 1 capsule by mouth every morning (before breakfast)            CONTINUE taking these medications      ondansetron  4 MG disintegrating tablet  Commonly known as: ZOFRAN -ODT  Take 1 tablet by mouth 3 times daily as needed for Nausea or Vomiting            ASK your doctor about these medications      azelastine  0.1 % nasal spray  Commonly known as: ASTELIN   2 sprays by Nasal route 2 times daily Use in each nostril as directed     HumuLIN  R U-500 KwikPen 500 UNIT/ML Sopn concentrated injection pen  Generic drug: insulin  regular human  Take 60 units daily when fasting BG <275. Take 70 units daily when fasting BG >275     levocetirizine 5 MG tablet  Commonly known as: XYZAL   Take 1 tablet by mouth nightly     linaCLOtide  72 MCG Caps capsule  Commonly known as: LINZESS   Take 1 capsule by mouth every morning (before breakfast)     senna-docusate 8.6-50 MG per tablet  Commonly known as: PERICOLACE  Take 1 tablet by mouth daily               Where to Get Your Medications        These medications were sent to [facility] [address] [place], [place] - [address] [address] [phone] - F [phone]  [address] [address] [place] [address]      Phone: [phone]   omeprazole 20 MG delayed release capsule  ondansetron  4 MG disintegrating tablet              I am  the Primary Clinician of Record.       (Please note that parts  of this dictation were completed with voice recognition software. Quite often unanticipated grammatical, syntax, homophones, and other interpretive errors are inadvertently transcribed by the computer software. Please disregards these errors. Please excuse any errors that have escaped final proofreading.)        Elmira Arlin PARAS, PA  08/21/23 2026       Elmira Arlin PARAS, [place]  08/21/23 2027

## 2023-08-22 ENCOUNTER — Telehealth: Payer: Self-pay

## 2023-08-22 MED ORDER — KETOROLAC TROMETHAMINE 15 MG/ML IJ SOLN
15 | INTRAMUSCULAR | Status: AC
Start: 2023-08-22 — End: 2023-08-21
  Administered 2023-08-22: 15 mg via INTRAVENOUS

## 2023-08-22 MED ORDER — OMEPRAZOLE 20 MG PO CPDR
20 | ORAL_CAPSULE | Freq: Every day | ORAL | 0 refills | 90.00000 days | Status: AC
Start: 2023-08-22 — End: 2023-09-20

## 2023-08-22 MED ORDER — PROMETHAZINE (PHENERGAN) 12.5 MG 50 ML IVPB
12.5 | INTRAVENOUS | Status: DC
Start: 2023-08-22 — End: 2023-08-21

## 2023-08-22 MED ORDER — ONDANSETRON 4 MG PO TBDP
4 | ORAL_TABLET | Freq: Three times a day (TID) | ORAL | 0 refills | 7.00000 days | Status: AC | PRN
Start: 2023-08-22 — End: ?

## 2023-08-22 MED FILL — KETOROLAC TROMETHAMINE 15 MG/ML IJ SOLN: 15 mg/mL | INTRAMUSCULAR | Qty: 1 | Fill #0

## 2023-08-22 MED FILL — PROMETHAZINE (PHENERGAN) 12.5 MG 50 ML IVPB: 12.5 mg/50 mL | INTRAVENOUS | Qty: 50 | Fill #0

## 2023-08-22 NOTE — Progress Notes (Signed)
 Pt has not been to clinic in quite some time.  I have ordered preparatory labs for next visit.    Please call pt to schedule two appointments: 1) lab appointment and 2) appointment to see me at least one week after that.     If pt no longer wished to return to [facility], please discharge them.    Thank you!

## 2023-08-22 NOTE — Transitions of Care (Post Inpatient/ED Visit) (Signed)
   08/22/2023  Name: Debbie Travis MRN: [id number] DOB: 05/16/1985  Today's TOC FU Call Status: Today's TOC FU Call Status:: Unsuccessful Call (1st Attempt) Unsuccessful Call (1st Attempt) Date: 08/22/23  Attempted to reach the patient regarding the most recent Inpatient/ED visit.  Follow Up Plan: Additional outreach attempts will be made to reach the patient to complete the Transitions of Care (Post Inpatient/ED visit) call.   Signature   Roetta Chard, CMA

## 2023-11-04 IMAGING — CR DG HIP (WITH OR WITHOUT PELVIS) 2-3V*R*
3 series · 3 of 3 positions shown · non-contrast
Comparison: None.

CLINICAL DATA: Fall

EXAM:
DG HIP (WITH OR WITHOUT PELVIS) 3V RIGHT

[pelvis ap]
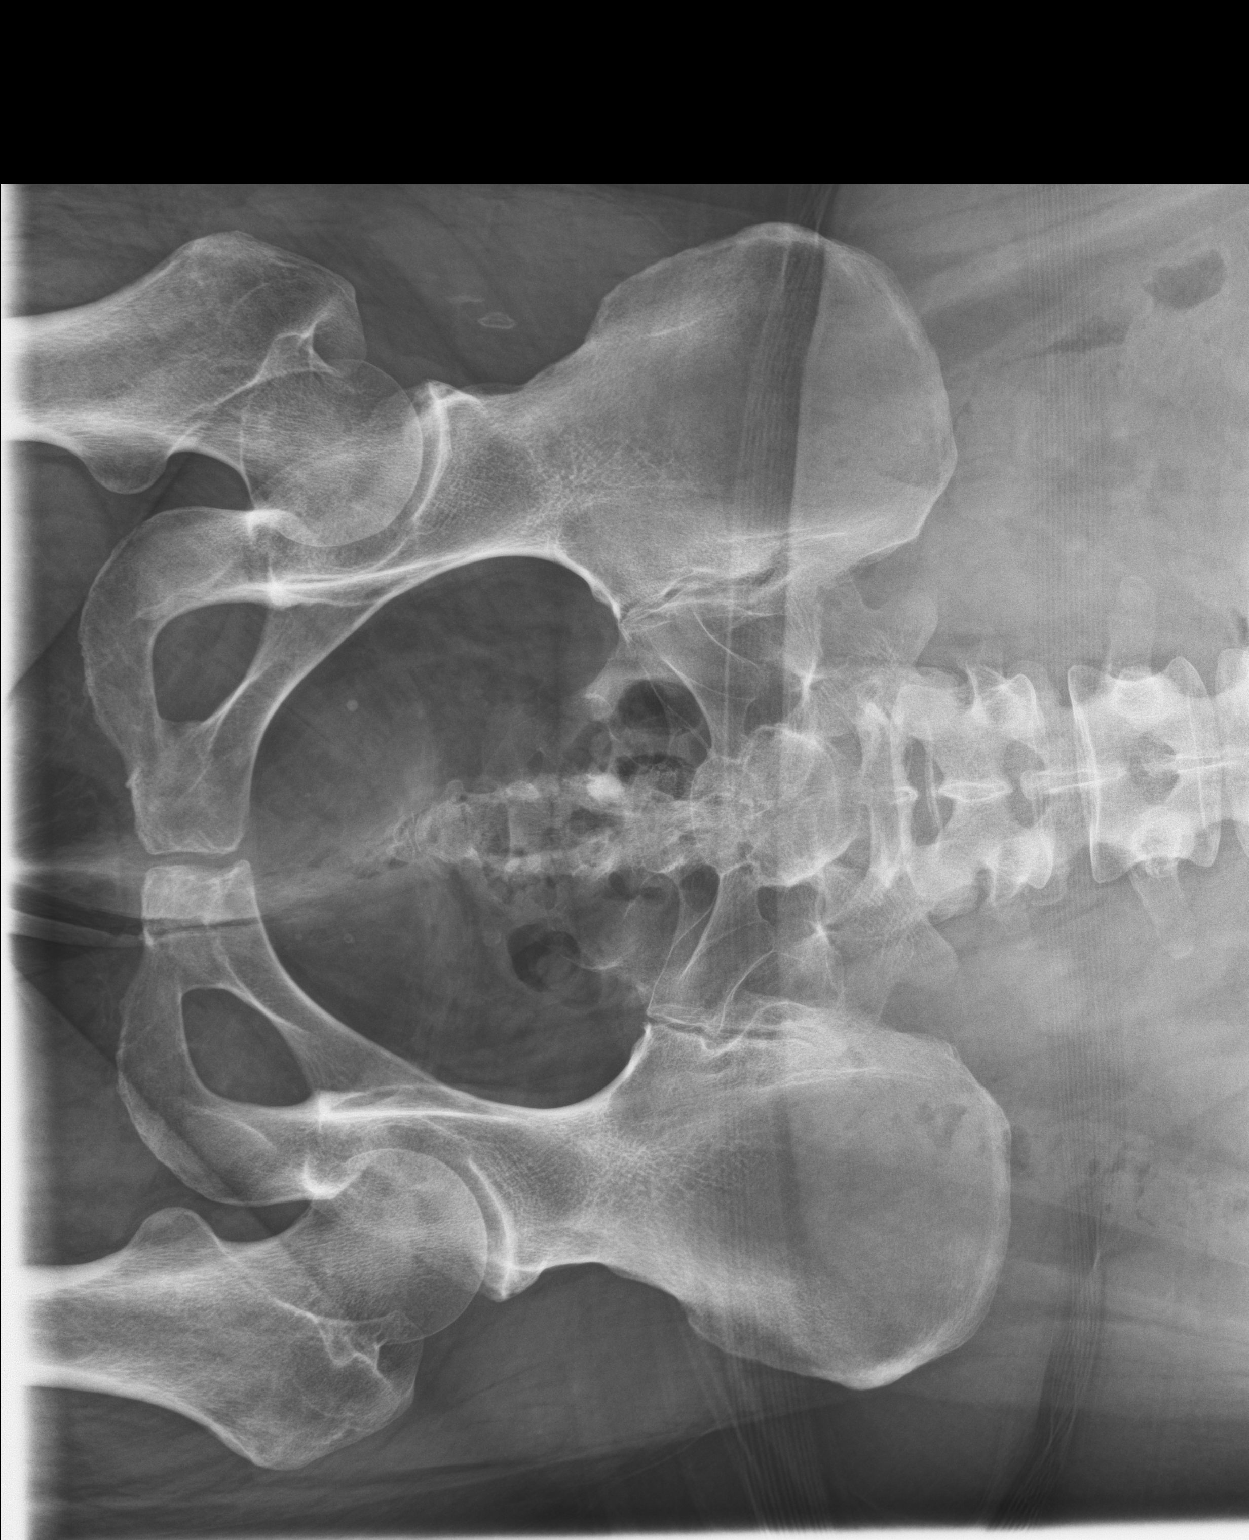

[hip ap]
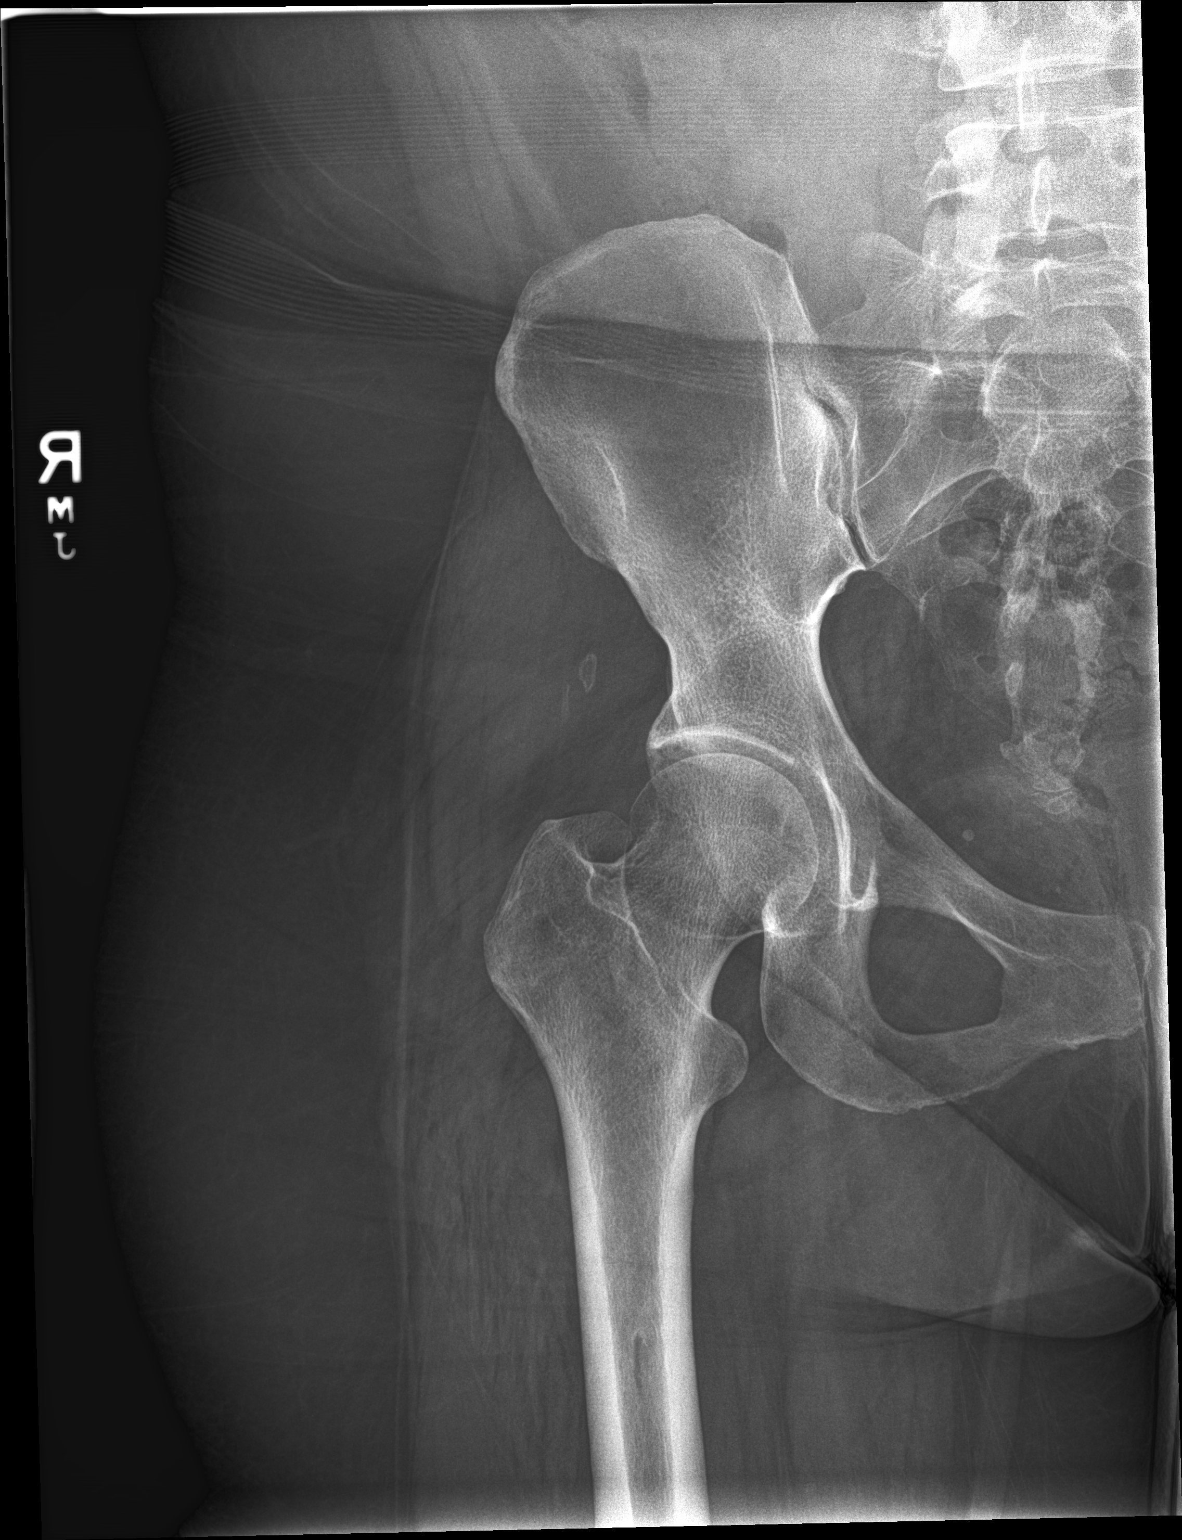

[hip lat]
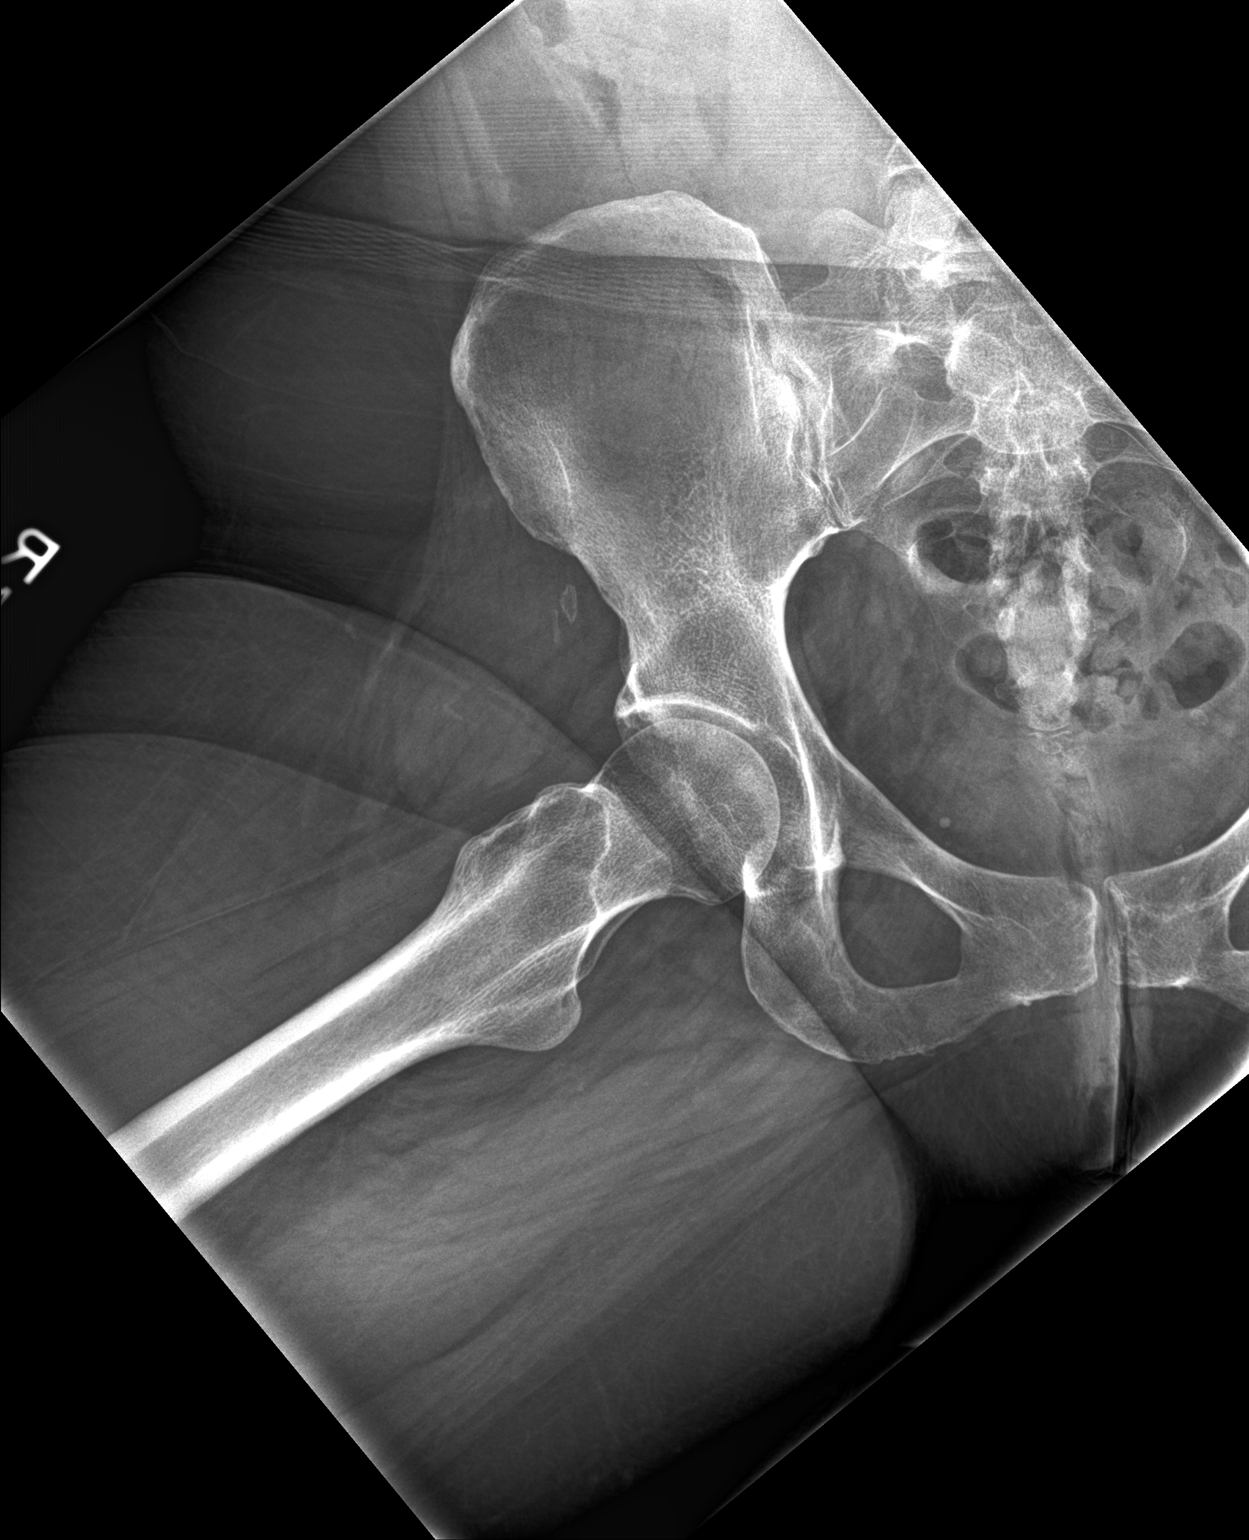

[3 of 3 positions shown; findings below may reference images not displayed]

FINDINGS: There is no evidence of hip fracture or dislocation. There is no
evidence of arthropathy or other focal bone abnormality.
IMPRESSION: Negative.

## 2023-11-04 IMAGING — CR DG ANKLE COMPLETE 3+V*R*
3 series · 3 of 3 positions shown · non-contrast
Comparison: None.

CLINICAL DATA: Status post fall in the foot.  Ankle pain.

EXAM:
RIGHT ANKLE - COMPLETE 3+ VIEW; RIGHT FOOT COMPLETE - 3+ VIEW

[ankle ap]
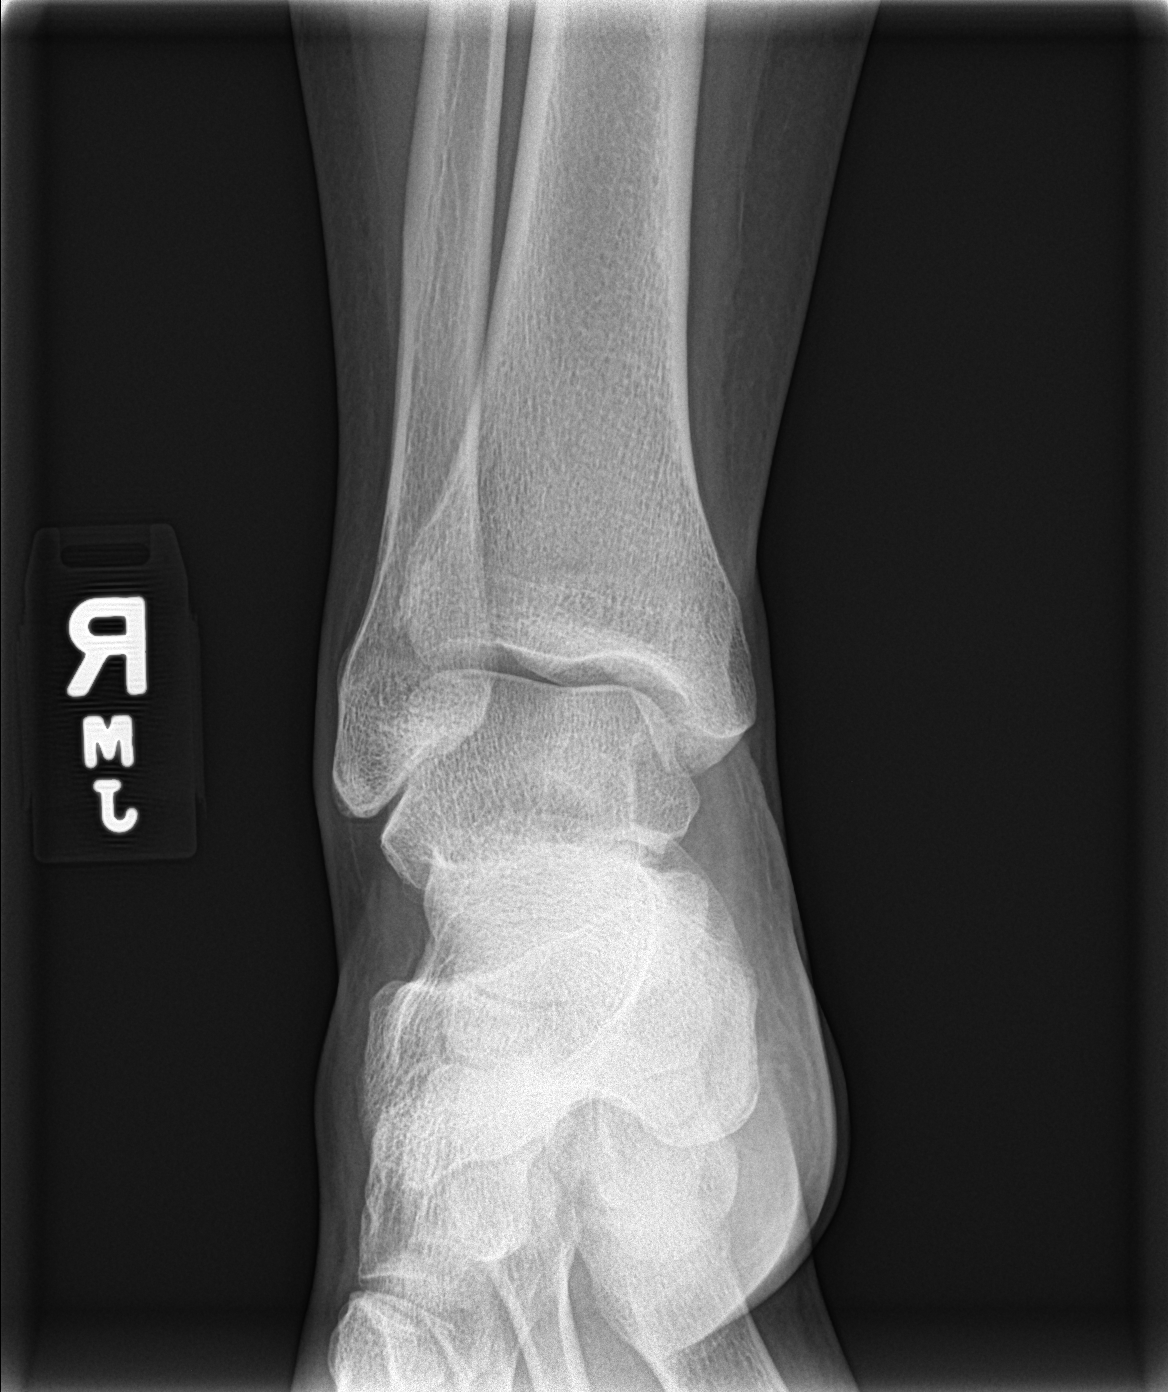

[ankle obl]
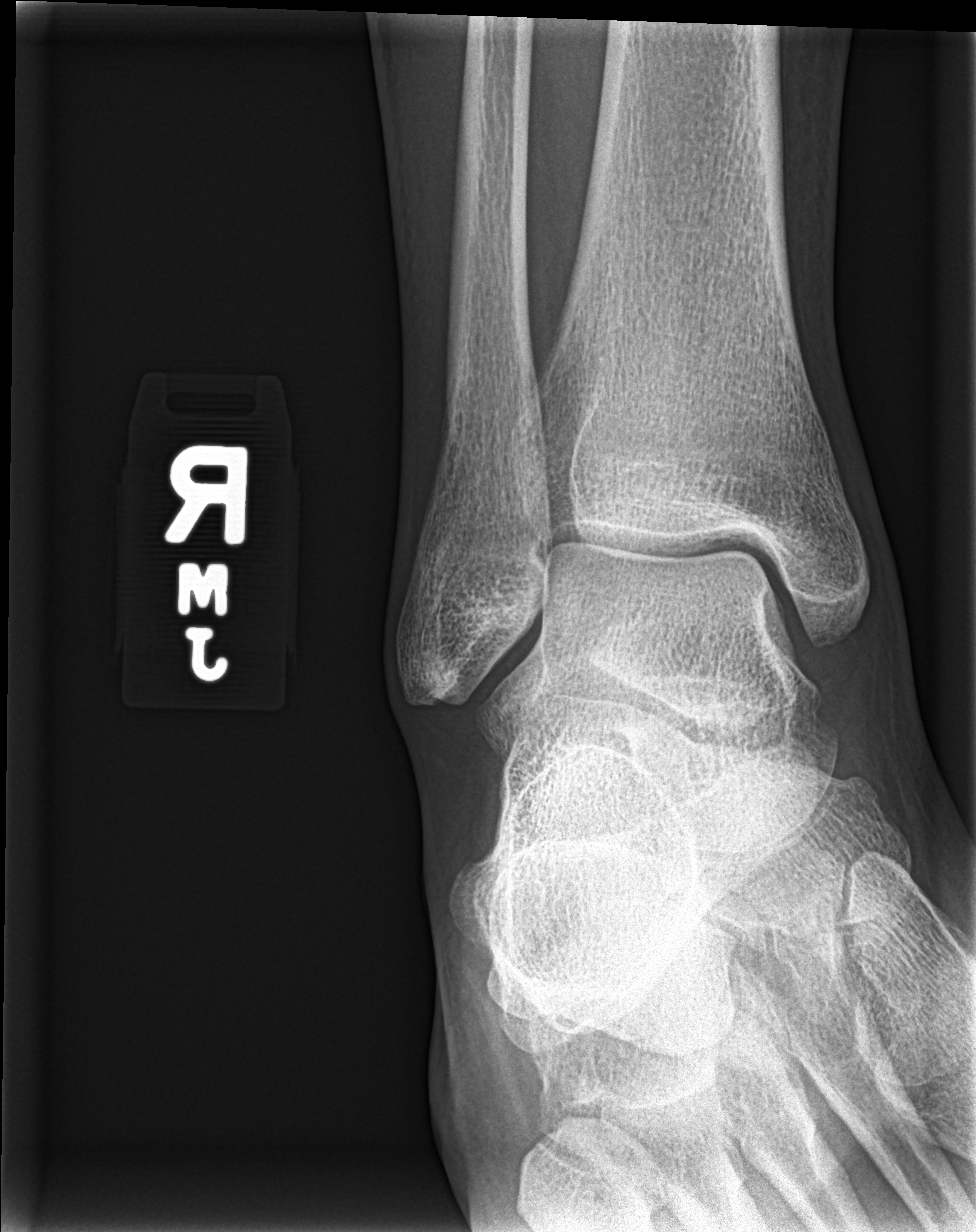

[ankle lat]
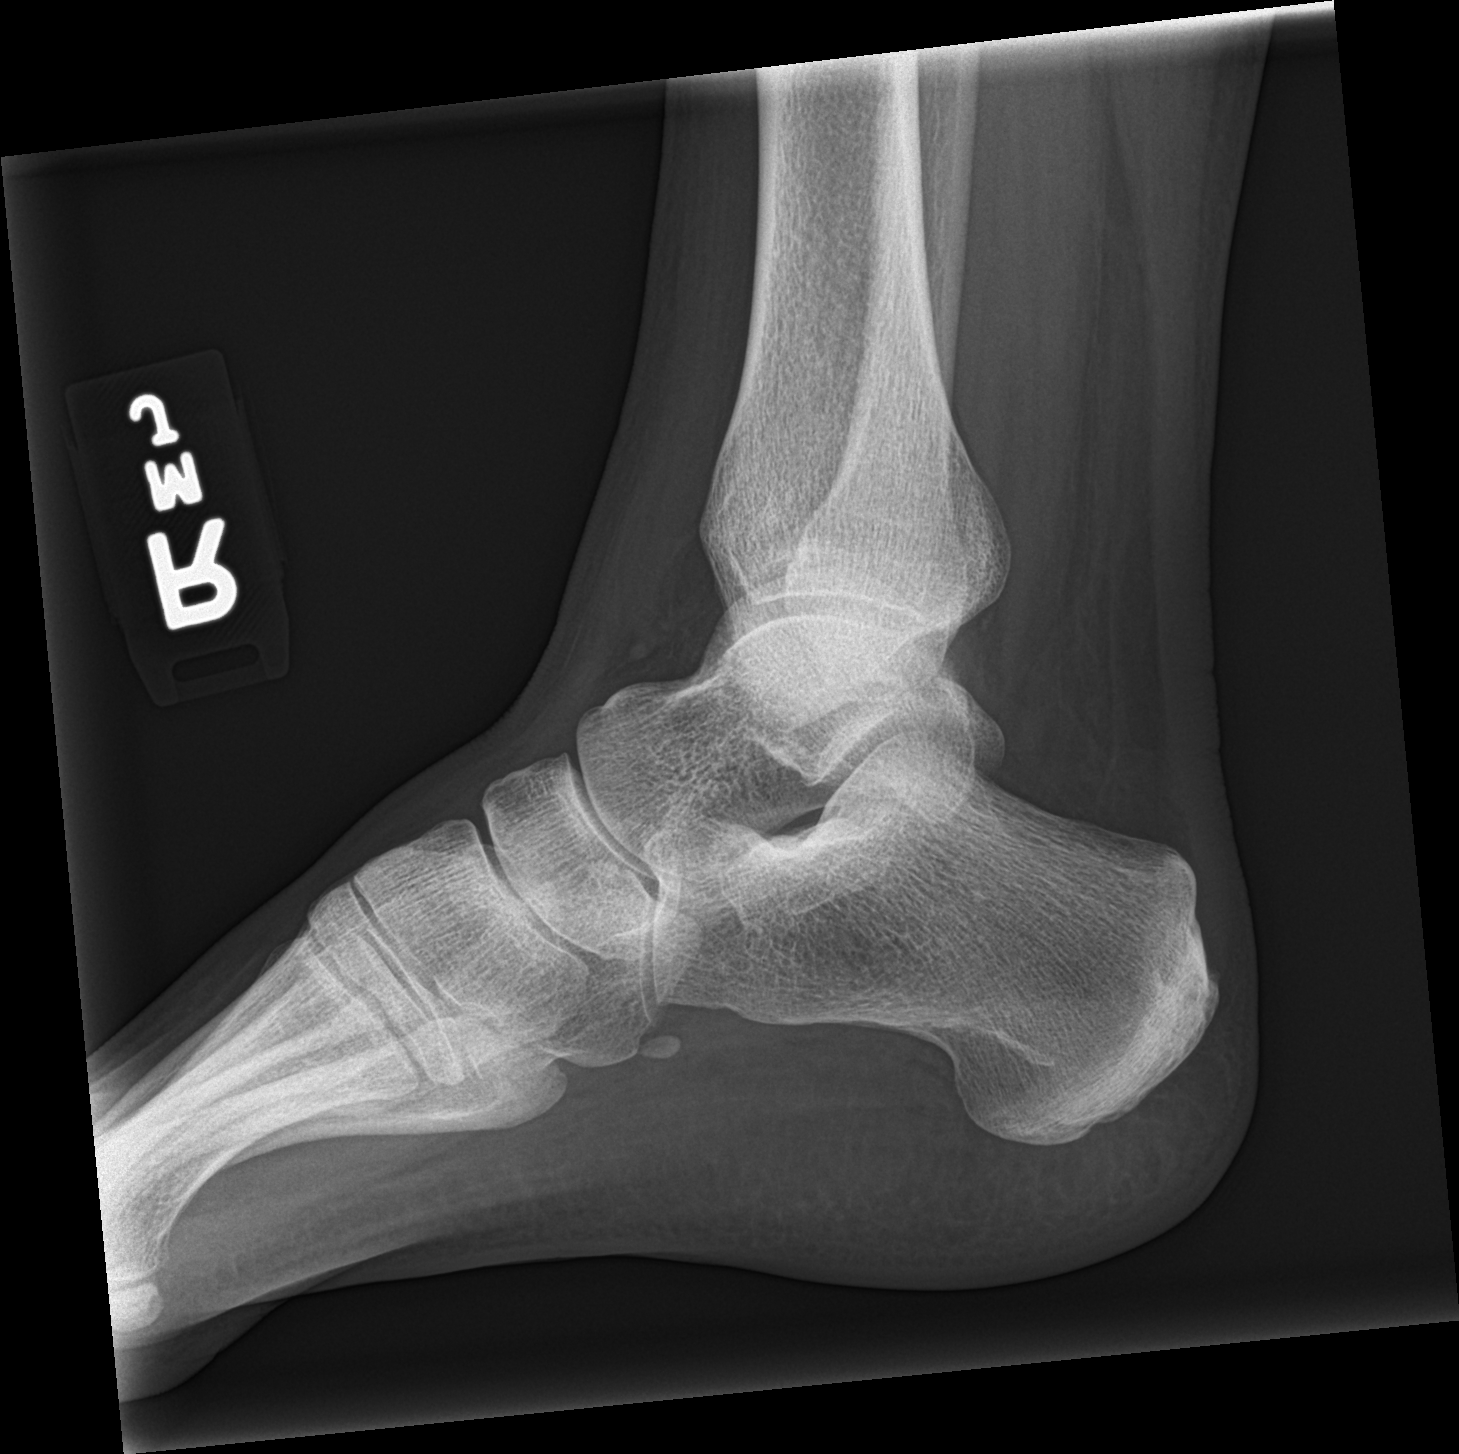

[3 of 3 positions shown; findings below may reference images not displayed]

FINDINGS: No acute fracture or dislocation. No aggressive osseous lesion.
Normal alignment.

Soft tissue are unremarkable. No radiopaque foreign body or soft
tissue emphysema.
IMPRESSION: 1.  No acute osseous injury of the right ankle.
2.  No acute osseous injury of the right foot.
